# Patient Record
Sex: Female | Born: 1940 | Race: Black or African American | Hispanic: No | Marital: Married | Smoking: Never smoker
Health system: Southern US, Community
[De-identification: ages and names within clinical notes are randomized; demographics above are authoritative.]

## PROBLEM LIST (undated history)

## (undated) DIAGNOSIS — I10 Essential (primary) hypertension: Secondary | ICD-10-CM

## (undated) DIAGNOSIS — I639 Cerebral infarction, unspecified: Secondary | ICD-10-CM

## (undated) HISTORY — PX: TUBAL LIGATION: SHX77

---

## 2010-09-18 ENCOUNTER — Emergency Department (HOSPITAL_COMMUNITY): Payer: Medicare Other

## 2010-09-18 ENCOUNTER — Emergency Department (HOSPITAL_COMMUNITY)
Admission: EM | Admit: 2010-09-18 | Discharge: 2010-09-18 | Disposition: A | Discharge status: Home / Self Care | Payer: Medicare Other | Attending: Emergency Medicine | Admitting: Emergency Medicine

## 2010-09-18 DIAGNOSIS — IMO0002 Reserved for concepts with insufficient information to code with codable children: Secondary | ICD-10-CM | POA: Insufficient documentation

## 2010-09-18 DIAGNOSIS — M79609 Pain in unspecified limb: Secondary | ICD-10-CM | POA: Insufficient documentation

## 2010-09-18 DIAGNOSIS — M545 Low back pain, unspecified: Secondary | ICD-10-CM | POA: Insufficient documentation

## 2011-05-27 ENCOUNTER — Emergency Department (HOSPITAL_COMMUNITY)
Admission: EM | Admit: 2011-05-27 | Discharge: 2011-05-27 | Disposition: A | Discharge status: Home / Self Care | Payer: Medicare HMO | Attending: Emergency Medicine | Admitting: Emergency Medicine

## 2011-05-27 DIAGNOSIS — I10 Essential (primary) hypertension: Secondary | ICD-10-CM | POA: Insufficient documentation

## 2011-05-27 DIAGNOSIS — R55 Syncope and collapse: Secondary | ICD-10-CM

## 2011-05-27 DIAGNOSIS — R404 Transient alteration of awareness: Secondary | ICD-10-CM | POA: Insufficient documentation

## 2011-05-27 DIAGNOSIS — F4321 Adjustment disorder with depressed mood: Secondary | ICD-10-CM

## 2011-05-27 NOTE — ED Provider Notes (Signed)
History     CSN: [id number]  Arrival date & time 05/27/11  2240   First MD Initiated Contact with Patient 05/27/11 2327      Chief Complaint  Patient presents with  . Panic Attack  . Loss of Consciousness  . Hypertension    (Consider location/radiation/quality/duration/timing/severity/associated sxs/prior treatment) HPI Comments: Patient had just found her husband expired after a long battle with cancer.  She became emotional and passed out.  She feels fine now and wants to go home.    Patient is a 71 y.o. female presenting with syncope and hypertension. The history is provided by the patient.  Loss of Consciousness This is a new problem. The current episode started less than 1 hour ago. The problem has been resolved.  Hypertension    No past medical history on file.  No past surgical history on file.  No family history on file.  History  Substance Use Topics  . Smoking status: Not on file  . Smokeless tobacco: Not on file  . Alcohol Use: Not on file    OB History    No data available      Review of Systems  Cardiovascular: Positive for syncope.  All other systems reviewed and are negative.    Allergies  Review of patient's allergies indicates no known allergies.  Home Medications  No current outpatient prescriptions on file.  BP 156/90  Pulse 97  Temp(Src) 98.6 F (37 C) (Oral)  Resp 18  SpO2 97%  Physical Exam  Nursing note and vitals reviewed. Constitutional: She is oriented to person, place, and time. She appears well-developed and well-nourished. No distress.  HENT:  Head: Normocephalic and atraumatic.  Neck: Normal range of motion. Neck supple.  Cardiovascular: Normal rate and regular rhythm.  Exam reveals no gallop and no friction rub.   No murmur heard. Pulmonary/Chest: Effort normal and breath sounds normal. No respiratory distress. She has no wheezes.  Abdominal: Soft. Bowel sounds are normal. She exhibits no distension. There is no  tenderness.  Musculoskeletal: Normal range of motion.  Neurological: She is alert and oriented to person, place, and time.  Skin: Skin is warm and dry. She is not diaphoretic.    ED Course  Procedures (including critical care time)  Labs Reviewed - No data to display No results found.   1. Syncope   2. Grief reaction       MDM  The patient looks fine.  Her exam and vital signs are okay.  She wants to go home.  I am okay with this as this appears to be an acute grief reaction.  She is to return prn.          Geoffery Lyons, MD 05/27/11 [phone]

## 2011-05-27 NOTE — ED Notes (Signed)
WUJ:[id number]<JY> Expected date:05/27/11<BR> Expected time:<BR> Means of arrival:<BR> Comments:<BR> EMS 50 GC - htn

## 2011-05-27 NOTE — ED Notes (Signed)
Brought in by EMS from home with c/o anxiety attack with syncope. Pt c/o "a little headache, that's all". Denies dizziness, nausea. Per grandson, pt had "passed out two times, first time for about 1 minute but then she passed out again". Per EMS, pt found her husband "not breathing" around 2125 tonight--- pt "does not seem to accept that fact"--- "we took her out to the living room and she kept saying 'check him out'".

## 2011-05-27 NOTE — Discharge Instructions (Signed)
Grief Reaction  Grief is a normal response to the death of someone close to you. Feelings of fear, anger, and guilt can affect almost everyone who loses someone they love. Symptoms of depression are also common. These include problems with sleep, loss of appetite, and lack of energy. These grief reaction symptoms often last for weeks to months after a loss. They may also return during special times that remind you of the person you lost, such as an anniversary or birthday.  Anxiety, insomnia, irritability, and deep depression may last beyond the period of normal grief. If you experience these feelings for 6 months or longer, you may have clinical depression. Clinical depression requires further medical attention. If you think that you have clinical depression, you should contact your caregiver. If you have a history of depression and or a family history of depression, you are at greater risk of clinical depression. You are also at greater risk of developing clinical depression if the loss was traumatic or the loss was of someone with whom you had unresolved issues.    A grief reaction can become complicated by being blocked. This means being unable to cry or express extreme emotions. This may prolong the grieving period and worsen the emotional effects of the loss. Mourning is a natural event in human life. A healthy grief reaction is one that is not blocked . It requires a time of sadness and readjustment.It is very important to share your sorrow and fear with others, especially close friends and family. Professional counselors and clergy can also help you process your grief.  Document Released: 02/23/2005 Document Revised: 02/12/2011 Document Reviewed: 11/03/2005  [employer] Patient Information 2012 [employer].

## 2013-01-31 ENCOUNTER — Emergency Department (HOSPITAL_COMMUNITY)
Admission: EM | Admit: 2013-01-31 | Discharge: 2013-01-31 | Disposition: A | Discharge status: Home / Self Care | Payer: Medicare HMO | Attending: Emergency Medicine | Admitting: Emergency Medicine

## 2013-01-31 ENCOUNTER — Emergency Department (HOSPITAL_COMMUNITY): Payer: Medicare HMO

## 2013-01-31 ENCOUNTER — Encounter (HOSPITAL_COMMUNITY): Payer: Self-pay | Admitting: Emergency Medicine

## 2013-01-31 DIAGNOSIS — R0789 Other chest pain: Secondary | ICD-10-CM

## 2013-01-31 DIAGNOSIS — I10 Essential (primary) hypertension: Secondary | ICD-10-CM | POA: Insufficient documentation

## 2013-01-31 DIAGNOSIS — F411 Generalized anxiety disorder: Secondary | ICD-10-CM | POA: Insufficient documentation

## 2013-01-31 HISTORY — DX: Essential (primary) hypertension: I10

## 2013-01-31 LAB — CBC
Hemoglobin: 13.1 g/dL (ref 12.0–15.0)
MCH: 27.8 pg (ref 26.0–34.0)
Platelets: 264 10*3/uL (ref 150–400)
RBC: 4.72 MIL/uL (ref 3.87–5.11)
WBC: 9.2 10*3/uL (ref 4.0–10.5)

## 2013-01-31 LAB — POCT I-STAT TROPONIN I: Troponin i, poc: 0.01 ng/mL (ref 0.00–0.08)

## 2013-01-31 LAB — BASIC METABOLIC PANEL
CO2: 25 mEq/L (ref 19–32)
Calcium: 9.7 mg/dL (ref 8.4–10.5)
Potassium: 4.8 mEq/L (ref 3.5–5.1)
Sodium: 136 mEq/L (ref 135–145)

## 2013-01-31 MED ORDER — IBUPROFEN 200 MG PO TABS
600.0000 mg | ORAL_TABLET | Freq: Once | ORAL | Status: AC
  Administered 2013-01-31: 600 mg via ORAL
  Filled 2013-01-31: qty 3

## 2013-01-31 NOTE — ED Notes (Addendum)
Pt and daugther report pt has been under a lot of stress, husband died last year, pt cries often. Pt fairly active, pt went to gym last night. Reports left sided chest pain all day 8/10. Denies SOB. Pt very anxious at present  Reports hx of HTN, but does not take medications at this time. Reports she needs to have them changed because they make her nauseas.

## 2013-01-31 NOTE — Progress Notes (Signed)
   CARE MANAGEMENT ED NOTE 01/31/2013  Patient:  [facility]   Account Number:  [id number]  Date Initiated:  01/31/2013  Documentation initiated by:  Edd Arbour  Subjective/Objective Assessment:   72 yr old medicare pt without a pcp listed in EPIC pt confirms pcp is gerald     Subjective/Objective Assessment Detail:   pt reports anxious chest pain after going to gym last pm temp 99.1 oral bp 175/82 first troponin not showing abnormality     Action/Plan:   spoke with pt and daughter at bedside EPIC updated   Action/Plan Detail:   Anticipated DC Date:  01/31/2013     Status Recommendation to Physician:   Result of Recommendation:    Other ED Services  Consult Working Plan    DC Planning Services  Other  PCP issues  Outpatient Services - Pt will follow up    Choice offered to / List presented to:            Status of service:  Completed, signed off  ED Comments:   ED Comments Detail:

## 2013-01-31 NOTE — ED Notes (Signed)
md at bedside  Pt alert and oriented x4. Respirations even and unlabored, bilateral symmetrical rise and fall of chest. Skin warm and dry. In no acute distress. Denies needs.   

## 2013-01-31 NOTE — ED Notes (Signed)
Pt back from x-ray.

## 2013-01-31 NOTE — ED Notes (Signed)
Pt escorted to discharge window. Pt verbalized understanding discharge instructions. In no acute distress.  

## 2013-01-31 NOTE — ED Provider Notes (Signed)
CSN: [id number]     Arrival date & time 01/31/13  1631 History   First MD Initiated Contact with Patient 01/31/13 1642     Chief Complaint  Patient presents with  . Chest Pain   (Consider location/radiation/quality/duration/timing/severity/associated sxs/prior Treatment) HPI  72yF with CP. Woke up with it this morning. L anterior chest. Does not radiate. Constant.  Worse with movement of L arm. Pt anxious. Very quick to offer various possible explanations (things she ate, gas, etc). Goes to gym regularly including last night. Walked 1.5 miles on treadmill w/o symptoms. Additionally worked out "my arms" on machine. No change in symptoms with exertion. No SOB, cough, palpitations, diaphoresis, nausea. Past hx of HTN, otherwise healthy. Previous stress testing "years ago." Never had cath.    Past Medical History  Diagnosis Date  . Hypertension    History reviewed. No pertinent past surgical history. History reviewed. No pertinent family history. History  Substance Use Topics  . Smoking status: Never Smoker   . Smokeless tobacco: Not on file  . Alcohol Use: No   OB History   Grav Para Term Preterm Abortions TAB SAB Ect Mult Living                 Review of Systems  All systems reviewed and negative, other than as noted in HPI.   Allergies  Review of patient's allergies indicates no known allergies.  Home Medications  No current outpatient prescriptions on file. BP 175/82  Pulse 90  Temp(Src) 99.1 F (37.3 C) (Oral)  Resp 14  Ht 5\' 11"  (1.803 m)  Wt 223 lb (101.152 kg)  BMI 31.12 kg/m2  SpO2 97% Physical Exam  Nursing note and vitals reviewed. Constitutional: She appears well-developed and well-nourished. No distress.  HENT:  Head: Normocephalic and atraumatic.  Eyes: Conjunctivae are normal. Right eye exhibits no discharge. Left eye exhibits no discharge.  Neck: Neck supple.  Cardiovascular: Normal rate, regular rhythm and normal heart sounds.  Exam reveals no  gallop and no friction rub.   No murmur heard. Pulmonary/Chest: Effort normal and breath sounds normal. No respiratory distress.  Abdominal: Soft. She exhibits no distension. There is no tenderness.  Musculoskeletal: She exhibits no edema and no tenderness.  Lower extremities symmetric as compared to each other. No calf tenderness. Negative Homan's. No palpable cords. Tenderness L anterior chest near delto/pectoral junction. Pain worse with adduction of L shoulder. No concerning skin lesions noted.   Neurological: She is alert.  Skin: Skin is warm and dry.  Psychiatric: She has a normal mood and affect. Her behavior is normal. Thought content normal.    ED Course  Procedures (including critical care time) Labs Review Labs Reviewed  BASIC METABOLIC PANEL - Abnormal; Notable for the following:    GFR calc non Af Amer 58 (*)    GFR calc Af Amer 67 (*)    All other components within normal limits  CBC  POCT I-STAT TROPONIN I   Imaging Review No results found.  Dg Chest 2 View  01/31/2013   CLINICAL DATA:  Left-sided chest pain.  EXAM: CHEST  2 VIEW  COMPARISON:  None.  FINDINGS: The heart size and mediastinal contours are within normal limits. Both lungs are clear. The visualized skeletal structures are unremarkable.  IMPRESSION: No active cardiopulmonary disease.   Electronically Signed   By: Myles Rosenthal M.D.   On: 01/31/2013 17:57   EKG Interpretation    Date/Time:  Tuesday January 31 2013 16:40:01 EST Ventricular Rate:  78 PR Interval:  181 QRS Duration: 67 QT Interval:  363 QTC Calculation: 413 R Axis:   -10 Text Interpretation:  Sinus rhythm Probable left atrial enlargement Low voltage, precordial leads Non-specific ST-t changes No old tracing to compare Confirmed by Rainey Rodger  MD, Chia Mowers ([id number]) on 01/31/2013 4:43:57 PM            MDM   1. Musculoskeletal chest pain     72yF with CP. Atypical for ACS with constant duration through out today. No change with  exertion. Reproducible with palpation and with ROM of L shoulder. Most consistent with musculoskeletal etiology, specifically soreness of L pectoral. Doubt PE, Infectious, dissection or other potentially emergent etiology. EKG with no overt ischemic changes. No comparison. CXR and labs fairly unremarkable. Plan symptomatic tx at this time.     Raeford Razor, MD 02/03/13 [phone]

## 2013-02-22 ENCOUNTER — Other Ambulatory Visit: Payer: Self-pay

## 2013-02-22 DIAGNOSIS — Z1231 Encounter for screening mammogram for malignant neoplasm of breast: Secondary | ICD-10-CM

## 2013-03-06 ENCOUNTER — Ambulatory Visit
Admission: RE | Admit: 2013-03-06 | Discharge: 2013-03-06 | Disposition: A | Discharge status: Home / Self Care | Payer: Medicare HMO | Source: Ambulatory Visit

## 2013-03-06 DIAGNOSIS — Z1231 Encounter for screening mammogram for malignant neoplasm of breast: Secondary | ICD-10-CM

## 2014-10-26 ENCOUNTER — Emergency Department (HOSPITAL_COMMUNITY): Payer: Medicare HMO

## 2014-10-26 ENCOUNTER — Emergency Department (HOSPITAL_COMMUNITY)
Admission: EM | Admit: 2014-10-26 | Discharge: 2014-10-26 | Disposition: A | Discharge status: Home / Self Care | Payer: Medicare HMO | Attending: Emergency Medicine | Admitting: Emergency Medicine

## 2014-10-26 ENCOUNTER — Encounter (HOSPITAL_COMMUNITY): Payer: Self-pay | Admitting: *Deleted

## 2014-10-26 DIAGNOSIS — M19012 Primary osteoarthritis, left shoulder: Secondary | ICD-10-CM | POA: Diagnosis not present

## 2014-10-26 DIAGNOSIS — M25512 Pain in left shoulder: Secondary | ICD-10-CM

## 2014-10-26 DIAGNOSIS — Z79899 Other long term (current) drug therapy: Secondary | ICD-10-CM | POA: Insufficient documentation

## 2014-10-26 DIAGNOSIS — I10 Essential (primary) hypertension: Secondary | ICD-10-CM | POA: Diagnosis not present

## 2014-10-26 LAB — I-STAT CHEM 8, ED
BUN: 18 mg/dL (ref 6–20)
CREATININE: 1 mg/dL (ref 0.44–1.00)
Calcium, Ion: 1.18 mmol/L (ref 1.13–1.30)
Chloride: 103 mmol/L (ref 101–111)
Glucose, Bld: 107 mg/dL — ABNORMAL HIGH (ref 65–99)
HEMATOCRIT: 44 % (ref 36.0–46.0)
HEMOGLOBIN: 15 g/dL (ref 12.0–15.0)
POTASSIUM: 4 mmol/L (ref 3.5–5.1)
SODIUM: 140 mmol/L (ref 135–145)
TCO2: 24 mmol/L (ref 0–100)

## 2014-10-26 LAB — I-STAT TROPONIN, ED: TROPONIN I, POC: 0 ng/mL (ref 0.00–0.08)

## 2014-10-26 MED ORDER — NAPROXEN 500 MG PO TABS: 500.0000 mg | ORAL_TABLET | Freq: Two times a day (BID) | ORAL | Status: DC

## 2014-10-26 MED ORDER — METHOCARBAMOL 500 MG PO TABS: 500.0000 mg | ORAL_TABLET | Freq: Two times a day (BID) | ORAL | Status: DC

## 2014-10-26 NOTE — ED Provider Notes (Signed)
CSN: [id number]     Arrival date & time 10/26/14  1239 History   First MD Initiated Contact with Patient 10/26/14 1350     Chief Complaint  Patient presents with  . Left Shoulder Pain      (Consider location/radiation/quality/duration/timing/severity/associated sxs/prior Treatment) HPI Courtney Holmes is a 74 y.o. female with history of hypertension, presents to emergency partner complaining of left shoulder pain. Patient states the pain has been intermittent for the last year. She reports that she recently had a husband who passed away that she cared for as well as a mother also passed away that she cared for. Patient states she had to do a lot of lifting, pushing, pulling. She states she is left-handed and believes she might have injured her shoulder at some point. She states pain is worsened with any movement. Pain comes and goes, has been getting worse in the last several days. She states painful to sleep on that shoulder. She has painful to raise her arm up or reach for anything. She states she gets some tingling sensation in left hand once in a while. She denies any neck pain or neck injuries. She denies any decreased strength in the hand. She has not tried any medications for this in the past. She denies any chest pain or shortness of breath. She denies any other focal neurological symptoms.  Past Medical History  Diagnosis Date  . Hypertension    History reviewed. No pertinent past surgical history. History reviewed. No pertinent family history. Social History  Substance Use Topics  . Smoking status: Never Smoker   . Smokeless tobacco: None  . Alcohol Use: No   OB History    No data available     Review of Systems  Constitutional: Negative for fever and chills.  Respiratory: Negative for cough, chest tightness and shortness of breath.   Cardiovascular: Negative for chest pain, palpitations and leg swelling.  Genitourinary: Negative for dysuria, flank pain, vaginal discharge  and pelvic pain.  Musculoskeletal: Positive for arthralgias. Negative for joint swelling, neck pain and neck stiffness.  Skin: Negative for rash.  Neurological: Negative for dizziness, weakness and headaches.  All other systems reviewed and are negative.     Allergies  Review of patient's allergies indicates no known allergies.  Home Medications   Prior to Admission medications   Medication Sig Start Date End Date Taking? Authorizing Provider  calcium-vitamin D (OSCAL WITH D) 500-200 MG-UNIT per tablet Take 1 tablet by mouth daily with breakfast.   Yes Historical Provider, MD  Multiple Vitamin (MULTIVITAMIN WITH MINERALS) TABS tablet Take 1 tablet by mouth daily.   Yes Historical Provider, MD  triamterene-hydrochlorothiazide (MAXZIDE-25) 37.5-25 MG per tablet Take 1 tablet by mouth every other day.  08/01/14  Yes Historical Provider, MD   BP 128/75 mmHg  Pulse 80  Temp(Src) 98.1 F (36.7 C) (Oral)  Resp 16  SpO2 96% Physical Exam  Constitutional: She is oriented to person, place, and time. She appears well-developed and well-nourished. No distress.  HENT:  Head: Normocephalic.  Eyes: Conjunctivae are normal.  Neck: Neck supple.  No midline tenderness. Full range of motion  Cardiovascular: Normal rate, regular rhythm and normal heart sounds.   Pulmonary/Chest: Effort normal and breath sounds normal. No respiratory distress. She has no wheezes. She has no rales.  Musculoskeletal: She exhibits no edema.  Tenderness over left trapezius and left periscapular muscles. Tenderness over left lateral. Tenderness over left deltoid. Pain with range of motion of the shoulder in  all directions. Positive arm drop test. Bicep and tricep strength intact. Grip strength is 5 out of 5 and equal. Sensation is intact in the left hand. No obvious swelling or joint effusion noted.  Neurological: She is alert and oriented to person, place, and time.  Skin: Skin is warm and dry.  Psychiatric: She has a  normal mood and affect. Her behavior is normal.  Nursing note and vitals reviewed.   ED Course  Procedures (including critical care time) Labs Review Labs Reviewed  I-STAT CHEM 8, ED - Abnormal; Notable for the following:    Glucose, Bld 107 (*)    All other components within normal limits  I-STAT TROPOININ, ED    Imaging Review Dg Chest 2 View  10/26/2014   CLINICAL DATA:  LEFT shoulder pains diverting 6 months ago. Initial encounter.  EXAM: CHEST  2 VIEW  COMPARISON:  01/31/2013.  FINDINGS: Cardiopericardial silhouette within normal limits. Mediastinal contours normal. Trachea midline. No airspace disease or effusion. Partially translucent monitoring leads project over the chest.  IMPRESSION: No active cardiopulmonary disease.   Electronically Signed   By: Andreas Newport M.D.   On: 10/26/2014 15:00   Dg Shoulder Left  10/26/2014   CLINICAL DATA:  Shoulder pain. Shoulder pain for 6 months. Initial encounter. Increasing pain over time.  EXAM: LEFT SHOULDER - 2+ VIEW  COMPARISON:  None.  FINDINGS: Mild glenohumeral osteoarthritis is present. Mild to moderate AC joint osteoarthritis. Glenohumeral joint is located. Negative for fracture. Visible RIGHT chest appears normal.  IMPRESSION: Mild osteoarthritis of the LEFT shoulder. No acute osseous abnormality.   Electronically Signed   By: Andreas Newport M.D.   On: 10/26/2014 14:59   I have personally reviewed and evaluated these images and lab results as part of my medical decision-making.   EKG Interpretation None      MDM   Final diagnoses:  Left shoulder pain  Primary osteoarthritis of left shoulder     patient with nontraumatic left shoulder pain for a year. Complains of some tingling in left hand. Discussed possibility of this being radicular pain versus shoulder pain. Triage nurses ordered blood work to rule out cardiac source. EKG unchanged with some artifact, labs including troponin and Chem-8 unremarkable. Patient's pain is  clearly a producible with left shoulder movement and palpation of the surrounding muscles. Her strength and sensation in the hand intact. I do not think this is due to a cardiac problem. X-ray showed osteo- arthritis. Plan to discharge home with NSAIDs, Robaxin for possible spasms. Follow-up with orthopedic specialist.  Pt neurovascularly intact. VS normal. Stable for dc/   Filed Vitals:   10/26/14 1248  BP: 128/75  Pulse: 80  Temp: 98.1 F (36.7 C)  TempSrc: Oral  Resp: 16  SpO2: 96%      Jaynie Crumble, PA-C 10/26/14 1525  Samuel Jester, DO 10/28/14 1428

## 2014-10-26 NOTE — Discharge Instructions (Signed)
Naprosyn for pain and inflammation. Robaxin for spasms. Try ice/heat. Follow up with primary care doctor or orthopedics specialist for further evaluation.   Shoulder Pain The shoulder is the joint that connects your arms to your body. The bones that form the shoulder joint include the upper arm bone (humerus), the shoulder blade (scapula), and the collarbone (clavicle). The top of the humerus is shaped like a ball and fits into a rather flat socket on the scapula (glenoid cavity). A combination of muscles and strong, fibrous tissues that connect muscles to bones (tendons) support your shoulder joint and hold the ball in the socket. Small, fluid-filled sacs (bursae) are located in different areas of the joint. They act as cushions between the bones and the overlying soft tissues and help reduce friction between the gliding tendons and the bone as you move your arm. Your shoulder joint allows a wide range of motion in your arm. This range of motion allows you to do things like scratch your back or throw a ball. However, this range of motion also makes your shoulder more prone to pain from overuse and injury. Causes of shoulder pain can originate from both injury and overuse and usually can be grouped in the following four categories:  Redness, swelling, and pain (inflammation) of the tendon (tendinitis) or the bursae (bursitis).  Instability, such as a dislocation of the joint.  Inflammation of the joint (arthritis).  Broken bone (fracture). HOME CARE INSTRUCTIONS   Apply ice to the sore area.  Put ice in a plastic bag.  Place a towel between your skin and the bag.  Leave the ice on for 15-20 minutes, 3-4 times per day for the first 2 days, or as directed by your health care provider.  Stop using cold packs if they do not help with the pain.  If you have a shoulder sling or immobilizer, wear it as long as your caregiver instructs. Only remove it to shower or bathe. Move your arm as little as  possible, but keep your hand moving to prevent swelling.  Squeeze a soft ball or foam pad as much as possible to help prevent swelling.  Only take over-the-counter or prescription medicines for pain, discomfort, or fever as directed by your caregiver. SEEK MEDICAL CARE IF:   Your shoulder pain increases, or new pain develops in your arm, hand, or fingers.  Your hand or fingers become cold and numb.  Your pain is not relieved with medicines. SEEK IMMEDIATE MEDICAL CARE IF:   Your arm, hand, or fingers are numb or tingling.  Your arm, hand, or fingers are significantly swollen or turn white or blue. MAKE SURE YOU:   Understand these instructions.  Will watch your condition.  Will get help right away if you are not doing well or get worse. Document Released: 12/03/2004 Document Revised: 07/10/2013 Document Reviewed: 02/07/2011 [facility] Patient Information 2015 [place], [place]. This information is not intended to replace advice given to you by your health care provider. Make sure you discuss any questions you have with your health care provider.

## 2014-10-26 NOTE — ED Notes (Addendum)
Pt reports she is left handed, was taking care of her disabled husband, started having left shoulder pain x6 months, pain increased over time. Pt reports started having left axilla pain x3 days. Pain increases with movement.  Denies SOB. Denies n/v. Denies cardiac hx.

## 2015-03-05 ENCOUNTER — Other Ambulatory Visit: Payer: Self-pay

## 2015-03-05 DIAGNOSIS — Z1231 Encounter for screening mammogram for malignant neoplasm of breast: Secondary | ICD-10-CM

## 2015-03-29 ENCOUNTER — Ambulatory Visit
Admission: RE | Admit: 2015-03-29 | Discharge: 2015-03-29 | Disposition: A | Discharge status: Home / Self Care | Payer: Medicare HMO | Source: Ambulatory Visit

## 2015-03-29 DIAGNOSIS — Z1231 Encounter for screening mammogram for malignant neoplasm of breast: Secondary | ICD-10-CM

## 2015-09-14 ENCOUNTER — Emergency Department (HOSPITAL_COMMUNITY)
Admission: EM | Admit: 2015-09-14 | Discharge: 2015-09-14 | Disposition: A | Discharge status: Home / Self Care | Payer: Medicare HMO | Attending: Emergency Medicine | Admitting: Emergency Medicine

## 2015-09-14 ENCOUNTER — Emergency Department (HOSPITAL_COMMUNITY): Payer: Medicare HMO

## 2015-09-14 ENCOUNTER — Encounter (HOSPITAL_COMMUNITY): Payer: Self-pay

## 2015-09-14 DIAGNOSIS — I10 Essential (primary) hypertension: Secondary | ICD-10-CM | POA: Diagnosis not present

## 2015-09-14 DIAGNOSIS — K5732 Diverticulitis of large intestine without perforation or abscess without bleeding: Secondary | ICD-10-CM | POA: Insufficient documentation

## 2015-09-14 DIAGNOSIS — Z79899 Other long term (current) drug therapy: Secondary | ICD-10-CM | POA: Insufficient documentation

## 2015-09-14 DIAGNOSIS — R1084 Generalized abdominal pain: Secondary | ICD-10-CM | POA: Diagnosis present

## 2015-09-14 LAB — URINALYSIS, ROUTINE W REFLEX MICROSCOPIC
BILIRUBIN URINE: NEGATIVE
GLUCOSE, UA: NEGATIVE mg/dL
KETONES UR: NEGATIVE mg/dL
LEUKOCYTES UA: NEGATIVE
Nitrite: NEGATIVE
PROTEIN: NEGATIVE mg/dL
Specific Gravity, Urine: 1.019 (ref 1.005–1.030)
pH: 5.5 (ref 5.0–8.0)

## 2015-09-14 LAB — COMPREHENSIVE METABOLIC PANEL
ALBUMIN: 4 g/dL (ref 3.5–5.0)
ALT: 14 U/L (ref 14–54)
AST: 15 U/L (ref 15–41)
Alkaline Phosphatase: 88 U/L (ref 38–126)
Anion gap: 8 (ref 5–15)
BUN: 10 mg/dL (ref 6–20)
CO2: 25 mmol/L (ref 22–32)
Calcium: 8.7 mg/dL — ABNORMAL LOW (ref 8.9–10.3)
Chloride: 102 mmol/L (ref 101–111)
Creatinine, Ser: 0.87 mg/dL (ref 0.44–1.00)
GFR calc Af Amer: >60 mL/min (ref >60)
GFR calc non Af Amer: >60 mL/min (ref >60)
Glucose, Bld: 110 mg/dL — ABNORMAL HIGH (ref 65–99)
POTASSIUM: 3.6 mmol/L (ref 3.5–5.1)
SODIUM: 135 mmol/L (ref 135–145)
TOTAL PROTEIN: 6.9 g/dL (ref 6.5–8.1)
Total Bilirubin: 2.1 mg/dL — ABNORMAL HIGH (ref 0.3–1.2)

## 2015-09-14 LAB — URINE MICROSCOPIC-ADD ON

## 2015-09-14 LAB — CBC
HEMATOCRIT: 34.9 % — AB (ref 36.0–46.0)
HEMOGLOBIN: 11.7 g/dL — AB (ref 12.0–15.0)
MCH: 27.5 pg (ref 26.0–34.0)
MCHC: 33.5 g/dL (ref 30.0–36.0)
MCV: 81.9 fL (ref 78.0–100.0)
Platelets: 217 10*3/uL (ref 150–400)
RBC: 4.26 MIL/uL (ref 3.87–5.11)
RDW: 13.6 % (ref 11.5–15.5)
WBC: 12.2 10*3/uL — AB (ref 4.0–10.5)

## 2015-09-14 MED ORDER — FENTANYL CITRATE (PF) 100 MCG/2ML IJ SOLN: 25.0000 ug | Freq: Once | INTRAMUSCULAR | Status: DC

## 2015-09-14 MED ORDER — SODIUM CHLORIDE 0.9 % IV BOLUS (SEPSIS)
500.0000 mL | Freq: Once | INTRAVENOUS | Status: AC
  Administered 2015-09-14: 500 mL via INTRAVENOUS

## 2015-09-14 MED ORDER — CIPROFLOXACIN HCL 500 MG PO TABS
500.0000 mg | ORAL_TABLET | Freq: Once | ORAL | Status: AC
  Administered 2015-09-14: 500 mg via ORAL
  Filled 2015-09-14: qty 1

## 2015-09-14 MED ORDER — CIPROFLOXACIN HCL 500 MG PO TABS: 500.0000 mg | ORAL_TABLET | Freq: Two times a day (BID) | ORAL | Status: DC

## 2015-09-14 MED ORDER — FENTANYL CITRATE (PF) 100 MCG/2ML IJ SOLN
12.5000 ug | Freq: Once | INTRAMUSCULAR | Status: AC
  Administered 2015-09-14: 12.5 ug via INTRAVENOUS
  Filled 2015-09-14: qty 2

## 2015-09-14 MED ORDER — IOPAMIDOL (ISOVUE-300) INJECTION 61%
100.0000 mL | Freq: Once | INTRAVENOUS | Status: AC | PRN
  Administered 2015-09-14: 100 mL via INTRAVENOUS

## 2015-09-14 MED ORDER — METRONIDAZOLE 500 MG PO TABS: 500.0000 mg | ORAL_TABLET | Freq: Two times a day (BID) | ORAL | Status: DC

## 2015-09-14 MED ORDER — HYDROCODONE-ACETAMINOPHEN 5-325 MG PO TABS
2.0000 | ORAL_TABLET | Freq: Once | ORAL | Status: AC
  Administered 2015-09-14: 2 via ORAL
  Filled 2015-09-14: qty 2

## 2015-09-14 MED ORDER — HYDROCODONE-ACETAMINOPHEN 5-325 MG PO TABS: 1.0000 | ORAL_TABLET | Freq: Four times a day (QID) | ORAL | Status: DC | PRN

## 2015-09-14 MED ORDER — METRONIDAZOLE 500 MG PO TABS
500.0000 mg | ORAL_TABLET | Freq: Once | ORAL | Status: AC
  Administered 2015-09-14: 500 mg via ORAL
  Filled 2015-09-14: qty 1

## 2015-09-14 MED ORDER — FAMOTIDINE IN NACL 20-0.9 MG/50ML-% IV SOLN
20.0000 mg | Freq: Once | INTRAVENOUS | Status: AC
  Administered 2015-09-14: 20 mg via INTRAVENOUS
  Filled 2015-09-14: qty 50

## 2015-09-14 NOTE — ED Notes (Signed)
Pt states she's had generalized low abdominal pain x few days.  C/O urinary frequency but not sure if she's emptying all the way.  LBM today and states they have been normal.  States she tripped over a kid on July 4th at a party hitting her LT arm & head.

## 2015-09-14 NOTE — Discharge Instructions (Signed)
Ms. Courtney GarnetBarbara Holmes,  Nice meeting you! Please follow-up with your primary care provider regarding the blood in your urine. You will need this rechecked in two weeks. Please follow-up with gastroenterology regarding your diverticulitis. Return to the emergency department if you develop fevers, chills, increased abdominal pain, inability to keep foods down, new/worsening symptoms. Feel better soon!  S. Lane HackerNicole Aubryana Vittorio, PA-C  Diverticulitis Diverticulitis is when small pockets that have formed in your colon (large intestine) become infected or swollen. HOME CARE  Follow your doctor's instructions.  Follow a special diet if told by your doctor.  When you feel better, your doctor may tell you to change your diet. You may be told to eat a lot of fiber. Fruits and vegetables are good sources of fiber. Fiber makes it easier to poop (have bowel movements).  Take supplements or probiotics as told by your doctor.  Only take medicines as told by your doctor.  Keep all follow-up visits with your doctor. GET HELP IF:  Your pain does not get better.  You have a hard time eating food.  You are not pooping like normal. GET HELP RIGHT AWAY IF:  Your pain gets worse.  Your problems do not get better.  Your problems suddenly get worse.  You have a fever.  You keep throwing up (vomiting).  You have bloody or black, tarry poop (stool). MAKE SURE YOU:   Understand these instructions.  Will watch your condition.  Will get help right away if you are not doing well or get worse.   This information is not intended to replace advice given to you by your health care provider. Make sure you discuss any questions you have with your health care provider.   Document Released: 08/12/2007 Document Revised: 02/28/2013 Document Reviewed: 01/18/2013 Elsevier Interactive Patient Education [employer]

## 2015-09-14 NOTE — ED Notes (Signed)
Bed: WTR7 Expected date:  Expected time:  Means of arrival:  Comments: Phlebotomy 

## 2015-09-14 NOTE — ED Provider Notes (Signed)
Medical screening examination/treatment/procedure(s) were conducted as a shared visit with non-physician practitioner(s) and myself.  I personally evaluated the patient during the encounter.   EKG Interpretation None     Patient here after having generalized lower abdominal pain times a few days. Abdominal CT positive for diverticulitis. She has no signs of acute abdomen. Patient be treated here and given GI referral  Lorre NickAnthony Berlyn Saylor, MD 09/14/15 [phone]

## 2015-09-14 NOTE — ED Provider Notes (Signed)
CSN: [id number]     Arrival date & time 09/14/15  1404 History   First MD Initiated Contact with Patient 09/14/15 1611     Chief Complaint  Patient presents with  . Abdominal Pain   HPI  Courtney Holmes is a 75 y.o. female PMH significant for HTN presenting with a 3 day history of lower abdominal pain and urinary frequency. She describes her pain as 10/10 pain scale, constant, lower in location, nonradiating, not related to BM/eating. She denies fevers, chills, SOB, CP, N/V/D, dysuria.    Past Medical History  Diagnosis Date  . Hypertension    Past Surgical History  Procedure Laterality Date  . Tubal ligation     No family history on file. Social History  Substance Use Topics  . Smoking status: Never Smoker   . Smokeless tobacco: Not on file  . Alcohol Use: No   OB History    No data available     Review of Systems  Ten systems are reviewed and are negative for acute change except as noted in the HPI  Allergies  Review of patient's allergies indicates no known allergies.  Home Medications   Prior to Admission medications   Medication Sig Start Date End Date Taking? Authorizing Provider  calcium-vitamin D (OSCAL WITH D) 500-200 MG-UNIT per tablet Take 1 tablet by mouth daily with breakfast.    Historical Provider, MD  methocarbamol (ROBAXIN) 500 MG tablet Take 1 tablet (500 mg total) by mouth 2 (two) times daily. 10/26/14   Tatyana Kirichenko, PA-C  Multiple Vitamin (MULTIVITAMIN WITH MINERALS) TABS tablet Take 1 tablet by mouth daily.    Historical Provider, MD  naproxen (NAPROSYN) 500 MG tablet Take 1 tablet (500 mg total) by mouth 2 (two) times daily. 10/26/14   Tatyana Kirichenko, PA-C  triamterene-hydrochlorothiazide (MAXZIDE-25) 37.5-25 MG per tablet Take 1 tablet by mouth every other day.  08/01/14   Historical Provider, MD   BP 144/70 mmHg  Pulse 82  Temp(Src) 99.2 F (37.3 C) (Oral)  Resp 18  Ht  (1.753 m)  Wt 102.059 kg  BMI 33.21 kg/m2  SpO2  96% Physical Exam  Constitutional: She appears well-developed and well-nourished. No distress.  HENT:  Head: Normocephalic and atraumatic.  Mouth/Throat: Oropharynx is clear and moist. No oropharyngeal exudate.  Eyes: Conjunctivae are normal. Pupils are equal, round, and reactive to light. Right eye exhibits no discharge. Left eye exhibits no discharge. No scleral icterus.  Neck: No tracheal deviation present.  Cardiovascular: Normal rate, regular rhythm, normal heart sounds and intact distal pulses.  Exam reveals no gallop and no friction rub.   No murmur heard. Pulmonary/Chest: Effort normal and breath sounds normal. No respiratory distress. She has no wheezes. She has no rales. She exhibits no tenderness.  Abdominal: Soft. Bowel sounds are normal. She exhibits no distension and no mass. There is tenderness. There is no rebound and no guarding.  BL lower abdominal tenderness.  Musculoskeletal: She exhibits no edema or tenderness.  No midline cervical, thoracic, lumbar tenderness.   Lymphadenopathy:    She has no cervical adenopathy.  Neurological: She is alert. Coordination normal.  Skin: Skin is warm and dry. No rash noted. She is not diaphoretic. No erythema.  Psychiatric: She has a normal mood and affect. Her behavior is normal.  Nursing note and vitals reviewed.   ED Course  Procedures  Labs Review Labs Reviewed  URINALYSIS, ROUTINE W REFLEX MICROSCOPIC (NOT AT [facility]) - Abnormal; Notable for the following:  Color, Urine AMBER (*)    APPearance CLOUDY (*)    Hgb urine dipstick TRACE (*)    All other components within normal limits  COMPREHENSIVE METABOLIC PANEL - Abnormal; Notable for the following:    Glucose, Bld 110 (*)    Calcium 8.7 (*)    Total Bilirubin 2.1 (*)    All other components within normal limits  CBC - Abnormal; Notable for the following:    WBC 12.2 (*)    Hemoglobin 11.7 (*)    HCT 34.9 (*)    All other components within normal limits  URINE  MICROSCOPIC-ADD ON - Abnormal; Notable for the following:    Squamous Epithelial / LPF 0-5 (*)    Bacteria, UA RARE (*)    All other components within normal limits    Imaging Review Ct Abdomen Pelvis W Contrast  09/14/2015  CLINICAL DATA:  Generalized low abdominal pain several days. EXAM: CT ABDOMEN AND PELVIS WITH CONTRAST TECHNIQUE: Multidetector CT imaging of the abdomen and pelvis was performed using the standard protocol following bolus administration of intravenous contrast. CONTRAST:  100mL ISOVUE-300 IOPAMIDOL (ISOVUE-300) INJECTION 61% COMPARISON:  None. FINDINGS: Lower chest: Lung bases are clear. Hepatobiliary: No focal hepatic lesion. Multiple gallstones the lumen gallbladder. No evidence acute inflammation. Common bile duct normal caliber. Pancreas: Pancreas is normal. No ductal dilatation. No pancreatic inflammation. Spleen: Normal spleen Adrenals/urinary tract: Adrenal glands and kidneys are normal. The ureters and bladder normal. Stomach/Bowel: Stomach, small bowel, appendix and cecum normal. Multiple diverticula scattered throughout the colon. 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 In the distal sigmoid colon, 8 cm segment of pericolonic inflammationand bowel wall thickening. Findings consistent with acute diverticulitis. No evidence of macro perforation or abscess. Rectum is normal. Vascular/Lymphatic: Abdominal aorta is normal caliber. There is no retroperitoneal or periportal lymphadenopathy. No pelvic lymphadenopathy. Reproductive: Uterus and ovaries normal. Other: No free fluid. Musculoskeletal: No aggressive osseous lesion. IMPRESSION: 1. Acute sigmoid diverticulitis. No evidence of macro perforation or abscess. 2. Cholelithiasis. Electronically Signed   By: Genevive BiStewart  Edmunds M.D.   On: 09/14/2015 18:08   I have personally reviewed and evaluated these images and lab results as part of my medical decision-making.  MDM   Final diagnoses:  Diverticulitis of large  intestine without perforation or abscess without bleeding   Patient nontoxic appearing, VSS. CMP with bili of 2.1, calcium of 8.7. CBC with leukocytosis of 12.2. UA with trace hgb and 6-30 RBC, negative for infection.  CT abdomen/pelvis demonstrates acute sigmoid diverticulitis without abscess or perforation. Patient given abx and GI referral. Patient may be safely discharged home with cipro, flagyl, norco. Discussed reasons for return. Patient to follow-up with GI. Patient in understanding and agreement with the plan.  Melton KrebsSamantha Nicole Careem Yasui, PA-C 09/27/15 1438

## 2017-10-30 IMAGING — CT CT ABD-PELV W/ CM
2 of 5 series · 17 of 46 positions shown, 19 images · IV contrast (iopamidol)
Comparison: None.

CLINICAL DATA: Generalized low abdominal pain several days.

EXAM:
CT ABDOMEN AND PELVIS WITH CONTRAST
TECHNIQUE: Multidetector CT imaging of the abdomen and pelvis was performed
using the standard protocol following bolus administration of
intravenous contrast.
CONTRAST:  100mL J4MEEP-N99 IOPAMIDOL (J4MEEP-N99) INJECTION 61%

[Series 2: abd/pel with · axial · 0.83mm/px · z∈[-393,-3]mm · 14 of 92 slices shown, 16 images]
[im 7/92  soft-tissue]
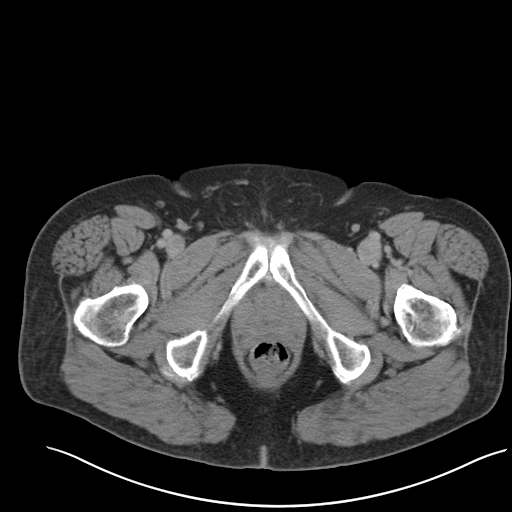
[im 7/92  bone]
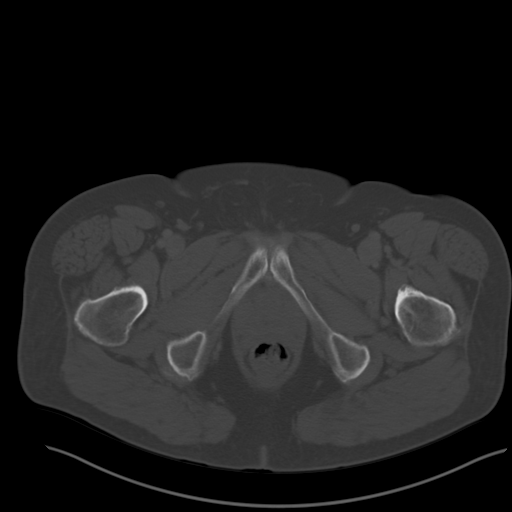
[im 13/92  soft-tissue]
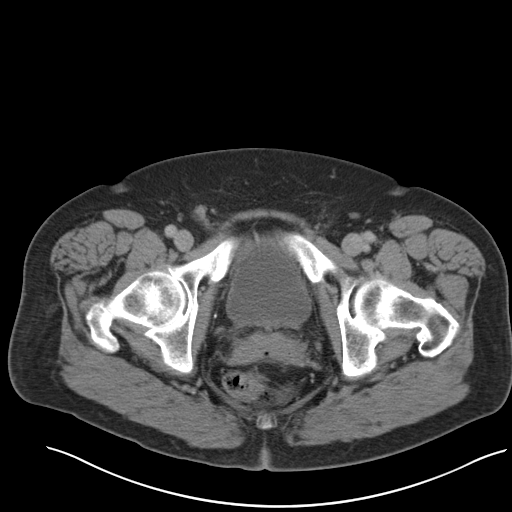
[im 19/92  soft-tissue]
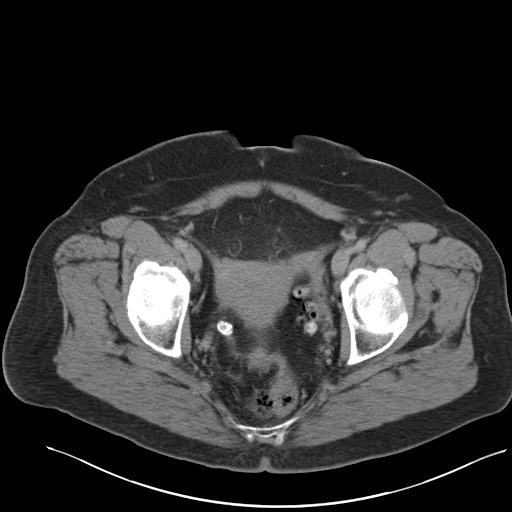
[im 25/92  soft-tissue]
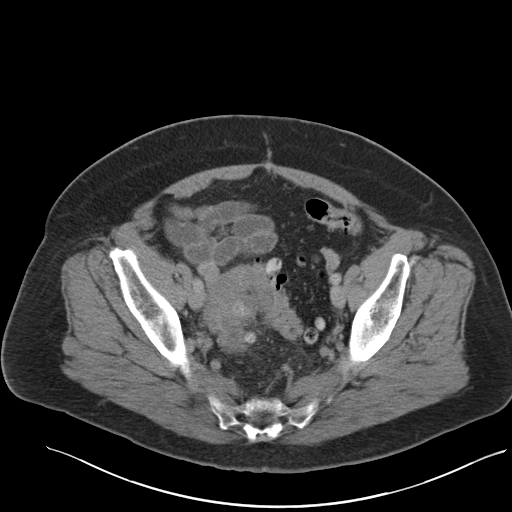
[im 31/92  soft-tissue]
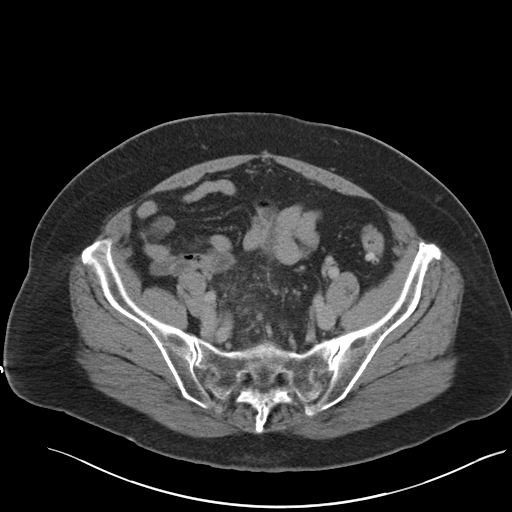
[im 37/92  soft-tissue]
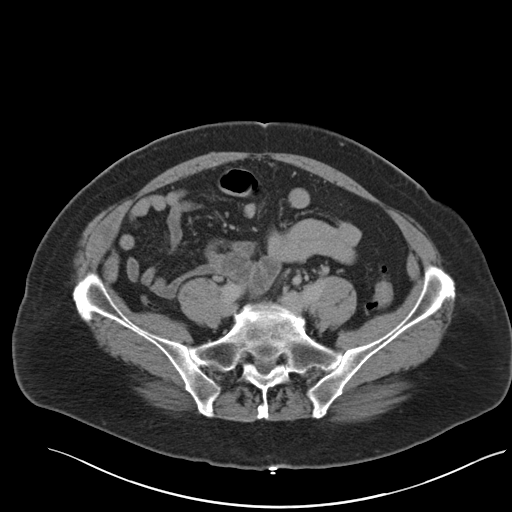
[im 43/92  soft-tissue]
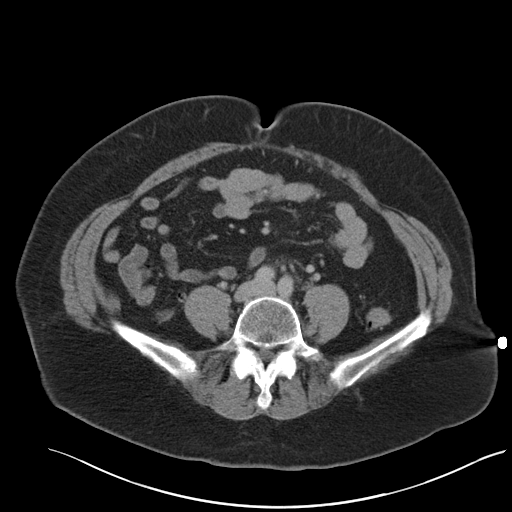
[im 49/92  soft-tissue]
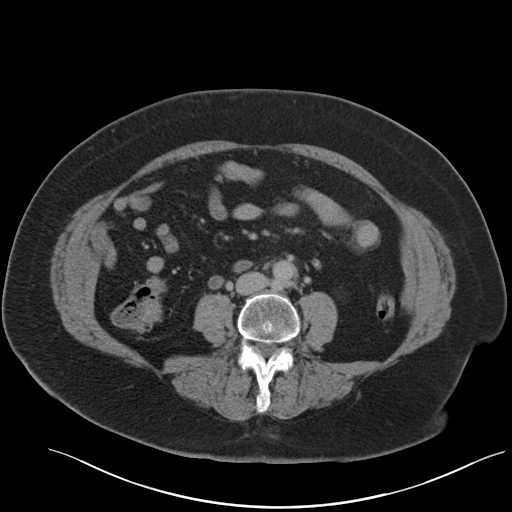
[im 55/92  soft-tissue]
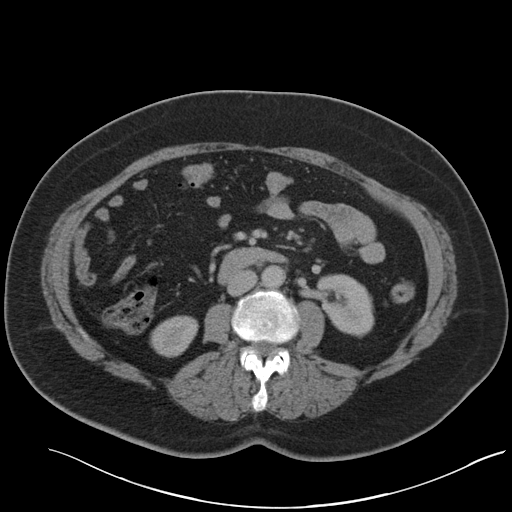
[im 55/92  bone]
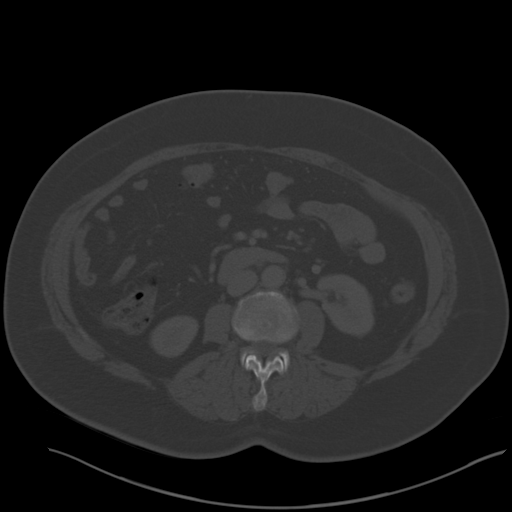
[im 61/92  soft-tissue]
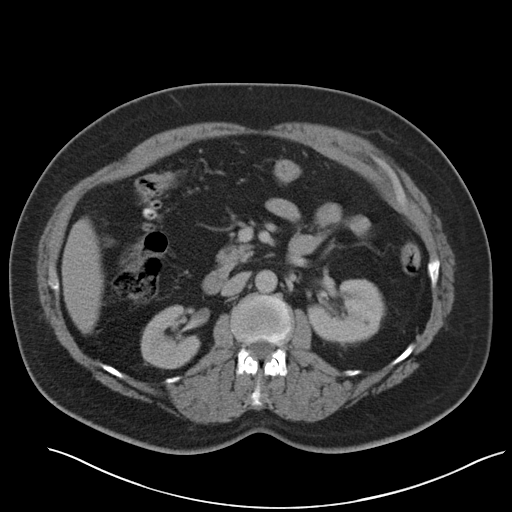
[im 67/92  soft-tissue]
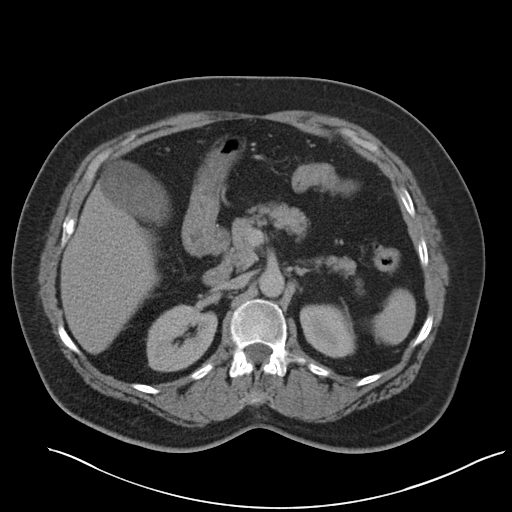
[im 73/92  soft-tissue]
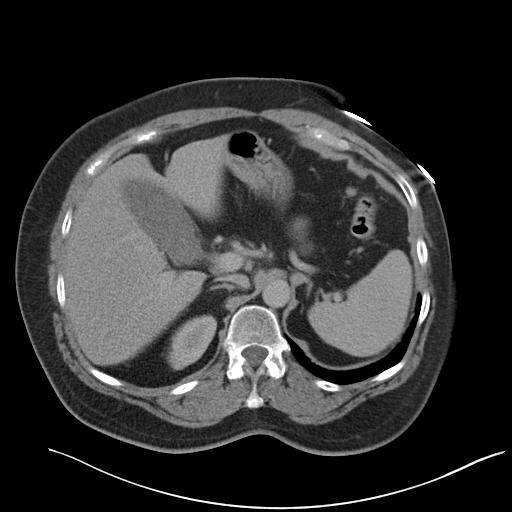
[im 79/92  soft-tissue]
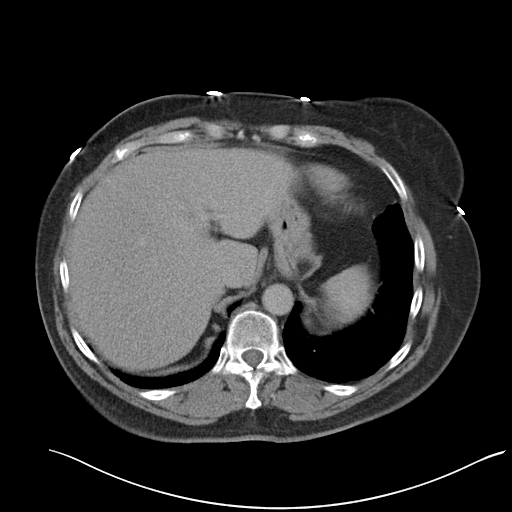
[im 85/92  soft-tissue]
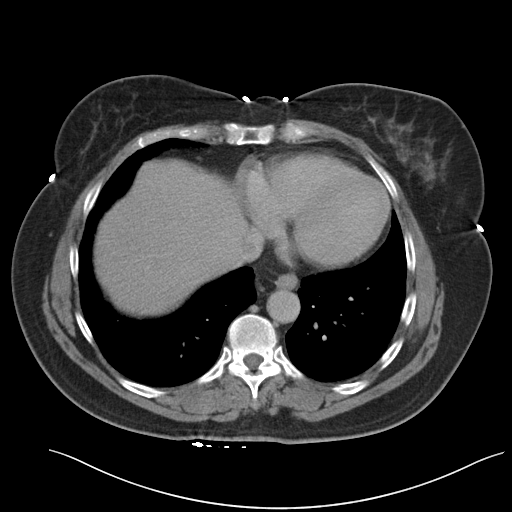

[Series 5: coronal a/|p · coronal · 0.85mm/px · 3 of 189 slices shown]
[im 63/189  soft-tissue]
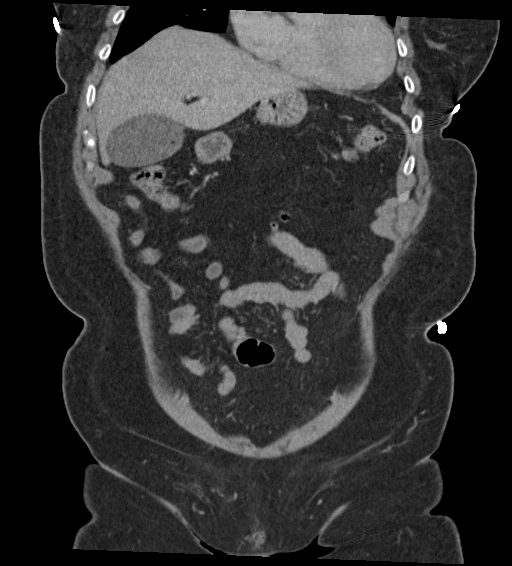
[im 84/189  soft-tissue]
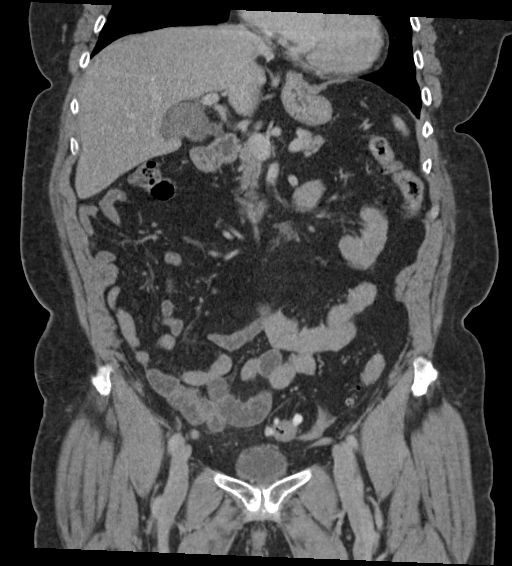
[im 105/189  soft-tissue]
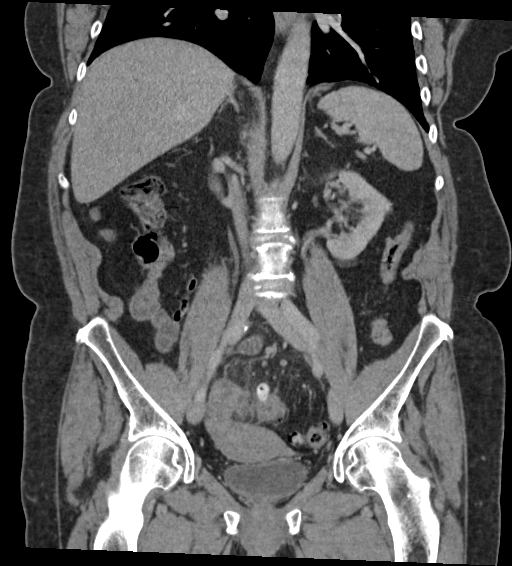

[17 of 46 positions shown; findings below may reference images not displayed]

FINDINGS: Lower chest: Lung bases are clear.

Hepatobiliary: No focal hepatic lesion. Multiple gallstones the
lumen gallbladder. No evidence acute inflammation. Common bile duct
normal caliber.

Pancreas: Pancreas is normal. No ductal dilatation. No pancreatic
inflammation.

Spleen: Normal spleen

Adrenals/urinary tract: Adrenal glands and kidneys are normal. The
ureters and bladder normal.

Stomach/Bowel: Stomach, small bowel, appendix and cecum normal.
Multiple diverticula scattered throughout the colon. Heavy
diverticular disease of the sigmoid colon. In the distal sigmoid
colon, 8 cm segment of pericolonic inflammationand bowel wall
thickening. Findings consistent with acute diverticulitis. No
evidence of macro perforation or abscess. Rectum is normal.

Vascular/Lymphatic: Abdominal aorta is normal caliber. There is no
retroperitoneal or periportal lymphadenopathy. No pelvic
lymphadenopathy.

Reproductive: Uterus and ovaries normal.

Other: No free fluid.

Musculoskeletal: No aggressive osseous lesion.
IMPRESSION: 1. Acute sigmoid diverticulitis. No evidence of macro perforation or
abscess.
2. Cholelithiasis.

## 2019-05-08 ENCOUNTER — Ambulatory Visit: Payer: Medicare HMO | Attending: Internal Medicine

## 2019-05-08 ENCOUNTER — Ambulatory Visit: Payer: Self-pay

## 2019-05-08 DIAGNOSIS — Z23 Encounter for immunization: Secondary | ICD-10-CM

## 2019-05-08 NOTE — Progress Notes (Signed)
   Covid-19 Vaccination Clinic  Name:  Courtney Holmes    MRN: [id number] DOB: 14-May-1940  05/08/2019  Courtney Holmes was observed post Covid-19 immunization for 15 minutes without incidence. She was provided with Vaccine Information Sheet and instruction to access the V-Safe system.   Courtney Holmes was instructed to call 911 with any severe reactions post vaccine: Marland Kitchen Difficulty breathing  . Swelling of your face and throat  . A fast heartbeat  . A bad rash all over your body  . Dizziness and weakness    Immunizations Administered    Name Date Dose VIS Date Route   Pfizer COVID-19 Vaccine 05/08/2019  9:38 AM 0.3 mL 02/17/2019 Intramuscular   Manufacturer: [employer], [employer]   Lot: [id number]   NDC: [address]-7

## 2019-06-06 ENCOUNTER — Ambulatory Visit: Payer: Medicare HMO | Attending: Internal Medicine

## 2019-06-06 DIAGNOSIS — Z23 Encounter for immunization: Secondary | ICD-10-CM

## 2019-06-06 NOTE — Progress Notes (Signed)
   Covid-19 Vaccination Clinic  Name:  Courtney Holmes    MRN: [id number] DOB: 1940/09/21  06/06/2019  [name] was observed post Covid-19 immunization for 15 minutes without incident. She was provided with Vaccine Information Sheet and instruction to access the V-Safe system.   [name] was instructed to call 911 with any severe reactions post vaccine: Marland Kitchen Difficulty breathing  . Swelling of face and throat  . A fast heartbeat  . A bad rash all over body  . Dizziness and weakness   Immunizations Administered    Name Date Dose VIS Date Route   Pfizer COVID-19 Vaccine 06/06/2019  9:05 AM 0.3 mL 02/17/2019 Intramuscular   Manufacturer: [employer], [employer]   Lot: [id number]   NDC: [address]-7

## 2023-02-27 ENCOUNTER — Encounter (HOSPITAL_COMMUNITY): Payer: Self-pay | Admitting: *Deleted

## 2023-02-27 ENCOUNTER — Other Ambulatory Visit: Payer: Self-pay

## 2023-02-27 ENCOUNTER — Emergency Department (HOSPITAL_COMMUNITY)
Admission: EM | Admit: 2023-02-27 | Discharge: 2023-02-28 | Disposition: A | Discharge status: Home / Self Care | Payer: Self-pay | Attending: Emergency Medicine | Admitting: Emergency Medicine

## 2023-02-27 DIAGNOSIS — R55 Syncope and collapse: Secondary | ICD-10-CM | POA: Insufficient documentation

## 2023-02-27 NOTE — ED Triage Notes (Signed)
The pt arrived by gems from home where there was a christmas party she stood up and she says everything went dark and she kind of fell over  no pain except around both her eyes and her nose

## 2023-02-27 NOTE — ED Provider Notes (Signed)
[name] EMERGENCY DEPARTMENT AT [facility] Provider Note   CSN: [id number] Arrival date & time: 02/27/23  2333     History {Add pertinent medical, surgical, social history, OB history to HPI:1} Chief Complaint  Patient presents with   Near Syncope    Courtney Holmes is a 82 y.o. female.  Patient presents to the emergency department after near syncopal episode.  Patient was at a holiday party tonight with family.  She had been sitting for a while, attempted to stand up and then things went black.  She reports that she fell back onto the couch, no injury.  She felt very weak and dazed for a while.  She has slowly come back to her normal baseline.  No palpitations, chest pain, shortness of breath.  Had a slight headache earlier, this has resolved.       Home Medications Prior to Admission medications   Medication Sig Start Date End Date Taking? Authorizing Provider  acetaminophen (TYLENOL) 500 MG tablet Take 200 mg by mouth 2 (two) times daily as needed for moderate pain or headache.    [provider]  calcium-vitamin D (OSCAL WITH D) 500-200 MG-UNIT per tablet Take 1 tablet by mouth daily with breakfast.    [provider]  ciprofloxacin (CIPRO) 500 MG tablet Take 1 tablet (500 mg total) by mouth 2 (two) times daily. 09/14/15   Melton Krebs, PA-C  HYDROcodone-acetaminophen (NORCO/VICODIN) 5-325 MG tablet Take 1-2 tablets by mouth every 6 (six) hours as needed. 09/14/15   Melton Krebs, PA-C  metroNIDAZOLE (FLAGYL) 500 MG tablet Take 1 tablet (500 mg total) by mouth 2 (two) times daily. 09/14/15   Melton Krebs, PA-C  Multiple Vitamin (MULTIVITAMIN WITH MINERALS) TABS tablet Take 1 tablet by mouth daily.    [provider]      Allergies    Patient has no known allergies.    Review of Systems   Review of Systems  Physical Exam Updated Vital Signs Ht 5\' 9"  (1.753 m)   Wt 102.1 kg   BMI 33.24 kg/m  Physical  Exam Vitals and nursing note reviewed.  Constitutional:      General: She is not in acute distress.    Appearance: She is well-developed.  HENT:     Head: Normocephalic and atraumatic.     Mouth/Throat:     Mouth: Mucous membranes are moist.  Eyes:     General: Vision grossly intact. Gaze aligned appropriately.     Extraocular Movements: Extraocular movements intact.     Conjunctiva/sclera: Conjunctivae normal.  Cardiovascular:     Rate and Rhythm: Normal rate and regular rhythm.     Pulses: Normal pulses.     Heart sounds: Normal heart sounds, S1 normal and S2 normal. No murmur heard.    No friction rub. No gallop.  Pulmonary:     Effort: Pulmonary effort is normal. No respiratory distress.     Breath sounds: Normal breath sounds.  Abdominal:     General: Bowel sounds are normal.     Palpations: Abdomen is soft.     Tenderness: There is no abdominal tenderness. There is no guarding or rebound.     Hernia: No hernia is present.  Musculoskeletal:        General: No swelling.     Cervical back: Full passive range of motion without pain, normal range of motion and neck supple. No spinous process tenderness or muscular tenderness. Normal range of motion.  Right lower leg: No edema.     Left lower leg: No edema.  Skin:    General: Skin is warm and dry.     Capillary Refill: Capillary refill takes less than 2 seconds.     Findings: No ecchymosis, erythema, rash or wound.  Neurological:     General: No focal deficit present.     Mental Status: She is alert and oriented to person, place, and time.     GCS: GCS eye subscore is 4. GCS verbal subscore is 5. GCS motor subscore is 6.     Cranial Nerves: Cranial nerves 2-12 are intact.     Sensory: Sensation is intact.     Motor: Motor function is intact.     Coordination: Coordination is intact.  Psychiatric:        Attention and Perception: Attention normal.        Mood and Affect: Mood normal.        Speech: Speech normal.         Behavior: Behavior normal.     ED Results / Procedures / Treatments   Labs (all labs ordered are listed, but only abnormal results are displayed) Labs Reviewed - No data to display  EKG None  Radiology No results found.  Procedures Procedures  {Document cardiac monitor, telemetry assessment procedure when appropriate:1}  Medications Ordered in ED Medications - No data to display  ED Course/ Medical Decision Making/ A&P   {   Click here for ABCD2, HEART and other calculatorsREFRESH Note before signing :1}                              Medical Decision Making Amount and/or Complexity of Data Reviewed Labs: ordered. Radiology: ordered.   ***  {Document critical care time when appropriate:1} {Document review of labs and clinical decision tools ie heart score, Chads2Vasc2 etc:1}  {Document your independent review of radiology images, and any outside records:1} {Document your discussion with family members, caretakers, and with consultants:1} {Document social determinants of health affecting pt's care:1} {Document your decision making why or why not admission, treatments were needed:1} Final Clinical Impression(s) / ED Diagnoses Final diagnoses:  None    Rx / DC Orders ED Discharge Orders     None

## 2023-02-28 ENCOUNTER — Emergency Department (HOSPITAL_COMMUNITY): Payer: Medicare HMO

## 2023-02-28 LAB — CBC WITH DIFFERENTIAL/PLATELET
Abs Immature Granulocytes: 0.02 10*3/uL (ref 0.00–0.07)
Basophils Absolute: 0 10*3/uL (ref 0.0–0.1)
Basophils Relative: 1 %
Eosinophils Absolute: 0.3 10*3/uL (ref 0.0–0.5)
Eosinophils Relative: 4 %
HCT: 37 % (ref 36.0–46.0)
Hemoglobin: 11.5 g/dL — ABNORMAL LOW (ref 12.0–15.0)
Immature Granulocytes: 0 %
Lymphocytes Relative: 39 %
Lymphs Abs: 2.7 10*3/uL (ref 0.7–4.0)
MCH: 27.3 pg (ref 26.0–34.0)
MCHC: 31.1 g/dL (ref 30.0–36.0)
MCV: 87.9 fL (ref 80.0–100.0)
Monocytes Absolute: 0.6 10*3/uL (ref 0.1–1.0)
Monocytes Relative: 8 %
Neutro Abs: 3.3 10*3/uL (ref 1.7–7.7)
Neutrophils Relative %: 48 %
Platelets: 227 10*3/uL (ref 150–400)
RBC: 4.21 MIL/uL (ref 3.87–5.11)
RDW: 13.7 % (ref 11.5–15.5)
WBC: 6.9 10*3/uL (ref 4.0–10.5)
nRBC: 0 % (ref 0.0–0.2)

## 2023-02-28 LAB — URINALYSIS, ROUTINE W REFLEX MICROSCOPIC
Bilirubin Urine: NEGATIVE
Glucose, UA: NEGATIVE mg/dL
Hgb urine dipstick: NEGATIVE
Ketones, ur: NEGATIVE mg/dL
Leukocytes,Ua: NEGATIVE
Nitrite: NEGATIVE
Protein, ur: NEGATIVE mg/dL
Specific Gravity, Urine: 1.017 (ref 1.005–1.030)
pH: 5 (ref 5.0–8.0)

## 2023-02-28 LAB — COMPREHENSIVE METABOLIC PANEL
ALT: 15 U/L (ref 0–44)
AST: 22 U/L (ref 15–41)
Albumin: 3.5 g/dL (ref 3.5–5.0)
Alkaline Phosphatase: 82 U/L (ref 38–126)
Anion gap: 13 (ref 5–15)
BUN: 26 mg/dL — ABNORMAL HIGH (ref 8–23)
CO2: 21 mmol/L — ABNORMAL LOW (ref 22–32)
Calcium: 9.1 mg/dL (ref 8.9–10.3)
Chloride: 108 mmol/L (ref 98–111)
Creatinine, Ser: 1.19 mg/dL — ABNORMAL HIGH (ref 0.44–1.00)
GFR, Estimated: 46 mL/min — ABNORMAL LOW (ref >60)
Glucose, Bld: 108 mg/dL — ABNORMAL HIGH (ref 70–99)
Potassium: 4 mmol/L (ref 3.5–5.1)
Sodium: 142 mmol/L (ref 135–145)
Total Bilirubin: 0.8 mg/dL (ref <1.2)
Total Protein: 6.3 g/dL — ABNORMAL LOW (ref 6.5–8.1)

## 2023-09-09 ENCOUNTER — Emergency Department (HOSPITAL_COMMUNITY)

## 2023-09-09 ENCOUNTER — Other Ambulatory Visit: Payer: Self-pay

## 2023-09-09 ENCOUNTER — Inpatient Hospital Stay (HOSPITAL_COMMUNITY)

## 2023-09-09 ENCOUNTER — Observation Stay (HOSPITAL_COMMUNITY)
Admission: EM | Admit: 2023-09-09 | Discharge: 2023-09-10 | Disposition: A | Discharge status: Home / Self Care | Attending: Internal Medicine | Admitting: Internal Medicine

## 2023-09-09 ENCOUNTER — Encounter (HOSPITAL_COMMUNITY): Payer: Self-pay

## 2023-09-09 DIAGNOSIS — Z7902 Long term (current) use of antithrombotics/antiplatelets: Secondary | ICD-10-CM | POA: Diagnosis not present

## 2023-09-09 DIAGNOSIS — Z8739 Personal history of other diseases of the musculoskeletal system and connective tissue: Secondary | ICD-10-CM | POA: Diagnosis not present

## 2023-09-09 DIAGNOSIS — M25512 Pain in left shoulder: Secondary | ICD-10-CM | POA: Insufficient documentation

## 2023-09-09 DIAGNOSIS — I639 Cerebral infarction, unspecified: Principal | ICD-10-CM

## 2023-09-09 DIAGNOSIS — I6389 Other cerebral infarction: Secondary | ICD-10-CM | POA: Diagnosis not present

## 2023-09-09 DIAGNOSIS — G894 Chronic pain syndrome: Secondary | ICD-10-CM | POA: Diagnosis not present

## 2023-09-09 DIAGNOSIS — Z7982 Long term (current) use of aspirin: Secondary | ICD-10-CM | POA: Diagnosis not present

## 2023-09-09 DIAGNOSIS — I10 Essential (primary) hypertension: Secondary | ICD-10-CM | POA: Insufficient documentation

## 2023-09-09 DIAGNOSIS — M25562 Pain in left knee: Secondary | ICD-10-CM | POA: Insufficient documentation

## 2023-09-09 DIAGNOSIS — M1712 Unilateral primary osteoarthritis, left knee: Secondary | ICD-10-CM | POA: Diagnosis not present

## 2023-09-09 DIAGNOSIS — M25569 Pain in unspecified knee: Secondary | ICD-10-CM | POA: Insufficient documentation

## 2023-09-09 DIAGNOSIS — R531 Weakness: Secondary | ICD-10-CM | POA: Diagnosis present

## 2023-09-09 LAB — PROTIME-INR
INR: 1 (ref 0.8–1.2)
Prothrombin Time: 14.2 s (ref 11.4–15.2)

## 2023-09-09 LAB — DIFFERENTIAL
Abs Immature Granulocytes: 0.02 10*3/uL (ref 0.00–0.07)
Basophils Absolute: 0 10*3/uL (ref 0.0–0.1)
Basophils Relative: 1 %
Eosinophils Absolute: 0.2 10*3/uL (ref 0.0–0.5)
Eosinophils Relative: 4 %
Immature Granulocytes: 0 %
Lymphocytes Relative: 41 %
Lymphs Abs: 2.4 10*3/uL (ref 0.7–4.0)
Monocytes Absolute: 0.5 10*3/uL (ref 0.1–1.0)
Monocytes Relative: 8 %
Neutro Abs: 2.7 10*3/uL (ref 1.7–7.7)
Neutrophils Relative %: 46 %

## 2023-09-09 LAB — COMPREHENSIVE METABOLIC PANEL WITH GFR
ALT: 23 U/L (ref 0–44)
AST: 28 U/L (ref 15–41)
Albumin: 3.9 g/dL (ref 3.5–5.0)
Alkaline Phosphatase: 85 U/L (ref 38–126)
Anion gap: 10 (ref 5–15)
BUN: 20 mg/dL (ref 8–23)
CO2: 24 mmol/L (ref 22–32)
Calcium: 9.3 mg/dL (ref 8.9–10.3)
Chloride: 108 mmol/L (ref 98–111)
Creatinine, Ser: 0.91 mg/dL (ref 0.44–1.00)
GFR, Estimated: >60 mL/min (ref >60)
Glucose, Bld: 104 mg/dL — ABNORMAL HIGH (ref 70–99)
Potassium: 4 mmol/L (ref 3.5–5.1)
Sodium: 142 mmol/L (ref 135–145)
Total Bilirubin: 0.8 mg/dL (ref 0.0–1.2)
Total Protein: 6.7 g/dL (ref 6.5–8.1)

## 2023-09-09 LAB — APTT: aPTT: 32 s (ref 24–36)

## 2023-09-09 LAB — CBC
HCT: 39 % (ref 36.0–46.0)
Hemoglobin: 12.2 g/dL (ref 12.0–15.0)
MCH: 27.3 pg (ref 26.0–34.0)
MCHC: 31.3 g/dL (ref 30.0–36.0)
MCV: 87.2 fL (ref 80.0–100.0)
Platelets: 223 10*3/uL (ref 150–400)
RBC: 4.47 MIL/uL (ref 3.87–5.11)
RDW: 14.1 % (ref 11.5–15.5)
WBC: 5.9 10*3/uL (ref 4.0–10.5)
nRBC: 0 % (ref 0.0–0.2)

## 2023-09-09 LAB — CBG MONITORING, ED: Glucose-Capillary: 116 mg/dL — ABNORMAL HIGH (ref 70–99)

## 2023-09-09 LAB — ETHANOL: Alcohol, Ethyl (B): <15 mg/dL (ref <15)

## 2023-09-09 MED ORDER — SODIUM CHLORIDE 0.9% FLUSH
3.0000 mL | Freq: Two times a day (BID) | INTRAVENOUS | Status: DC
  Administered 2023-09-09 – 2023-09-10 (×2): 10 mL via INTRAVENOUS

## 2023-09-09 MED ORDER — SODIUM CHLORIDE 0.9% FLUSH: 3.0000 mL | INTRAVENOUS | Status: DC | PRN

## 2023-09-09 MED ORDER — HYDRALAZINE HCL 20 MG/ML IJ SOLN
10.0000 mg | Freq: Four times a day (QID) | INTRAMUSCULAR | Status: DC | PRN
  Administered 2023-09-10 (×2): 10 mg via INTRAVENOUS
  Filled 2023-09-09 (×2): qty 1

## 2023-09-09 MED ORDER — ASPIRIN 325 MG PO TABS: 325.0000 mg | ORAL_TABLET | Freq: Once | ORAL | Status: DC

## 2023-09-09 MED ORDER — STROKE: EARLY STAGES OF RECOVERY BOOK
Freq: Once | Status: AC
  Filled 2023-09-09 (×2): qty 1

## 2023-09-09 MED ORDER — ASPIRIN 81 MG PO TBEC
81.0000 mg | DELAYED_RELEASE_TABLET | Freq: Every day | ORAL | Status: DC
  Administered 2023-09-10: 81 mg via ORAL
  Filled 2023-09-09: qty 1

## 2023-09-09 MED ORDER — CLOPIDOGREL BISULFATE 75 MG PO TABS
75.0000 mg | ORAL_TABLET | Freq: Every day | ORAL | Status: DC
  Administered 2023-09-10: 75 mg via ORAL
  Filled 2023-09-09: qty 1

## 2023-09-09 MED ORDER — IOHEXOL 350 MG/ML SOLN
75.0000 mL | Freq: Once | INTRAVENOUS | Status: AC | PRN
  Administered 2023-09-09: 75 mL via INTRAVENOUS

## 2023-09-09 MED ORDER — ENOXAPARIN SODIUM 40 MG/0.4ML IJ SOSY
40.0000 mg | PREFILLED_SYRINGE | INTRAMUSCULAR | Status: DC
  Administered 2023-09-09: 40 mg via SUBCUTANEOUS
  Filled 2023-09-09: qty 0.4

## 2023-09-09 MED ORDER — CLOPIDOGREL BISULFATE 300 MG PO TABS
300.0000 mg | ORAL_TABLET | Freq: Once | ORAL | Status: AC
  Administered 2023-09-09: 300 mg via ORAL
  Filled 2023-09-09: qty 1

## 2023-09-09 MED ORDER — ATORVASTATIN CALCIUM 10 MG PO TABS
20.0000 mg | ORAL_TABLET | Freq: Every day | ORAL | Status: DC
  Administered 2023-09-09 – 2023-09-10 (×2): 20 mg via ORAL
  Filled 2023-09-09 (×2): qty 2

## 2023-09-09 MED ORDER — LORAZEPAM 0.5 MG PO TABS
0.5000 mg | ORAL_TABLET | Freq: Once | ORAL | Status: AC
  Administered 2023-09-09: 0.5 mg via ORAL
  Filled 2023-09-09: qty 1

## 2023-09-09 MED ORDER — ASPIRIN 81 MG PO CHEW
324.0000 mg | CHEWABLE_TABLET | Freq: Once | ORAL | Status: AC
  Administered 2023-09-09: 324 mg via ORAL
  Filled 2023-09-09: qty 4

## 2023-09-09 MED ORDER — ACETAMINOPHEN 325 MG PO TABS
650.0000 mg | ORAL_TABLET | ORAL | Status: DC | PRN
  Administered 2023-09-10: 650 mg via ORAL
  Filled 2023-09-09: qty 2

## 2023-09-09 MED ORDER — ACETAMINOPHEN 160 MG/5ML PO SOLN: 650.0000 mg | ORAL | Status: DC | PRN

## 2023-09-09 MED ORDER — SENNOSIDES-DOCUSATE SODIUM 8.6-50 MG PO TABS: 1.0000 | ORAL_TABLET | Freq: Every evening | ORAL | Status: DC | PRN

## 2023-09-09 MED ORDER — ACETAMINOPHEN 650 MG RE SUPP: 650.0000 mg | RECTAL | Status: DC | PRN

## 2023-09-09 NOTE — ED Provider Notes (Signed)
 [place] EMERGENCY DEPARTMENT AT [facility] Provider Note   CSN: [id number] Arrival date & time: 09/09/23  1617     Patient presents with: Stroke Like Symptoms   Courtney Holmes is a 83 y.o. female.   83 year old female with prior medical history as detailed below presents for evaluation.  Patient complains of intermittent left shoulder pain x 1 to 2 weeks.  She has been seen by her PCP and diagnosed with possible arthritis versus possible rotator cuff injury.  Over the last 2 to 3 days, patient has also noticed intermittent left arm and left leg tingling and weakness.  She reports that the symptoms are currently improved.    She reports a history of mini strokes in the past.  These were diagnosed and treated in [place]  prior to moving [place].  She is not currently taking any daily medications.  She denies any other medical problems such as hypertension, diabetes, etc.  The history is provided by the patient.       Prior to Admission medications   Medication Sig Start Date End Date Taking? Authorizing Provider  acetaminophen  (TYLENOL ) 500 MG tablet Take 200 mg by mouth 2 (two) times daily as needed for moderate pain or headache.    [provider]  calcium-vitamin D (OSCAL WITH D) 500-200 MG-UNIT per tablet Take 1 tablet by mouth daily with breakfast.    [provider]  ciprofloxacin  (CIPRO ) 500 MG tablet Take 1 tablet (500 mg total) by mouth 2 (two) times daily. 09/14/15   Carlo Lucie Garre, PA-C  HYDROcodone -acetaminophen  (NORCO/VICODIN) 5-325 MG tablet Take 1-2 tablets by mouth every 6 (six) hours as needed. 09/14/15   Carlo Lucie Garre, PA-C  metroNIDAZOLE  (FLAGYL ) 500 MG tablet Take 1 tablet (500 mg total) by mouth 2 (two) times daily. 09/14/15   Carlo Lucie Garre, PA-C  Multiple Vitamin (MULTIVITAMIN WITH MINERALS) TABS tablet Take 1 tablet by mouth daily.    [provider]    Allergies: Patient has no known allergies.     Review of Systems  All other systems reviewed and are negative.   Updated Vital Signs BP (!) 167/69   Pulse 84   Temp 98.7 F (37.1 C)   Resp 15   Ht 5' 9 (1.753 m)   Wt 102.1 kg   SpO2 96%   BMI 33.24 kg/m   Physical Exam Vitals and nursing note reviewed.  Constitutional:      General: She is not in acute distress.    Appearance: Normal appearance. She is well-developed.  HENT:     Head: Normocephalic and atraumatic.  Eyes:     Conjunctiva/sclera: Conjunctivae normal.     Pupils: Pupils are equal, round, and reactive to light.  Cardiovascular:     Rate and Rhythm: Normal rate and regular rhythm.     Heart sounds: Normal heart sounds.  Pulmonary:     Effort: Pulmonary effort is normal. No respiratory distress.     Breath sounds: Normal breath sounds.  Abdominal:     General: There is no distension.     Palpations: Abdomen is soft.     Tenderness: There is no abdominal tenderness.  Musculoskeletal:        General: No deformity. Normal range of motion.     Cervical back: Normal range of motion and neck supple.  Skin:    General: Skin is warm and dry.  Neurological:     General: No focal deficit present.     Mental  Status: She is alert and oriented to person, place, and time.     (all labs ordered are listed, but only abnormal results are displayed) Labs Reviewed  CBG MONITORING, ED - Abnormal; Notable for the following components:      Result Value   Glucose-Capillary 116 (*)    All other components within normal limits  PROTIME-INR  APTT  CBC  DIFFERENTIAL  COMPREHENSIVE METABOLIC PANEL WITH GFR  ETHANOL  CBG MONITORING, ED  I-STAT CHEM 8, ED    EKG: EKG Interpretation Date/Time:  Thursday September 09 2023 16:28:40 EDT Ventricular Rate:  73 PR Interval:  159 QRS Duration:  96 QT Interval:  394 QTC Calculation: 435 R Axis:   1  Text Interpretation: Sinus rhythm Consider anterior infarct Confirmed by Laurice Coy [phone]) on 09/09/2023 4:47:57  PM  Radiology: No results found.   Procedures   Medications Ordered in the ED - No data to display                                  Medical Decision Making Amount and/or Complexity of Data Reviewed Labs: ordered. Radiology: ordered.  Risk Prescription drug management.    Medical Screen Complete  This patient presented to the ED with complaint of weakness.  This complaint involves an extensive number of treatment options. The initial differential diagnosis includes, but is not limited to, CVA, metabolic abnormality, etc.  This presentation is: Acute, Self-Limited, Previously Undiagnosed, Uncertain Prognosis, Complicated, Systemic Symptoms, and Threat to Life/Bodily Function Patient is presenting with complaint of intermittent left arm and left leg weakness.  Patient with symptoms for at least 2 to 3 days per her report.  Patient reports prior history of mini strokes previously.  She is not currently on any medications.  Patient is grossly intact on exam.  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  Case discussed with neurology.  Patient would benefit from admission.  Neurology would prefer that the patient be admitted to [facility].  Hospitalist service made aware of case.   Additional history obtained: External records from outside sources obtained and reviewed including prior ED visits and prior Inpatient records.    Problem List / ED Course:  CVA Disposition:  After consideration of the diagnostic results and the patients response to treatment, I feel that the patent would benefit from admission.  CRITICAL CARE Performed by: Coy JAYSON Laurice   Total critical care time: 30 minutes  Critical care time was exclusive of separately billable procedures and treating other patients.  Critical care was necessary to treat or prevent imminent or life-threatening deterioration.  Critical care was time spent personally by me on the following  activities: development of treatment plan with patient and/or surrogate as well as nursing, discussions with consultants, evaluation of patient's response to treatment, examination of patient, obtaining history from patient or surrogate, ordering and performing treatments and interventions, ordering and review of laboratory studies, ordering and review of radiographic studies, pulse oximetry and re-evaluation of patient's condition.        Final diagnoses:  Cerebrovascular accident (CVA), unspecified mechanism [facility])    ED Discharge Orders     None          Laurice Coy JAYSON, MD 09/09/23 2043

## 2023-09-09 NOTE — ED Triage Notes (Signed)
 Pt reports L shoulder pain and L knee pain along with decreased sensation. LKN 4 days ago. Pt seen by PCP and sent for imaging to r/o rotator cuff injury. No other neuro sxs.

## 2023-09-09 NOTE — H&P (Signed)
 History and Physical    Courtney Holmes FMW:[id number] DOB: 1940-04-20 DOA: 09/09/2023  PCP: Joshua Francisco, MD   Patient coming from: Home   Chief Complaint:  Chief Complaint  Patient presents with   Stroke Like Symptoms   ED TRIAGE note:  Pt reports L shoulder pain and L knee pain along with decreased sensation. LKN 4 days ago. Pt seen by PCP and sent for imaging to r/o rotator cuff injury. No other neuro sxs.             HPI:  Courtney Holmes is a 83 y.o. female with medical history significant of CVA, chronic pain syndrome and chronic osteoarthritis presented to emergency department complaining of left-sided shoulder pain and left-sided knee pain with decreased sensation over the course of last 4 days.  Patient has been seen primary care physician who sent to ED for concern for rotator cuff injury. Patient reported that she has intermittent left-sided shoulder pain for 1 to 2 weeks and diagnosed with possible arthritis with rotator cuff injury. Over the last 2 to 3 days patient also has intermittent left arm and left leg tingling and weakness.   During my evaluation at the bedside physical exam revealed left-sided upper and lower extremity muscle weakness and decreased muscle strength as compared to right side and patient also has some tingling and numbness sensation of the fingertips of the left hand.  Patient also reported feeling light to touch sensation on the left-sided upper and lower extremities as compared to right side.  Denies any dysarthria, dysphagia, dizziness, headache, blurry vision, syncope, presyncope, seizure or loss of consciousness.  Denies any chest pain, palpitation, orthopnea, lower extremity swelling, shortness of breath and cough.  Patient reported she has a history of mini stroke in the past that was diagnosed and treated in [place]  prior to moving to [place] .  Patient does not have any other past medical history and does not take any medications at  home.  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  ED physician discussed case with on-call neurology who recommended admit to [facility].   ED Course:  Presentation to ED patient found to be hypertensive blood pressure 167/69 otherwise hemodynamically stable.  CBC and CMP unremarkable.  Blood alcohol within normal range.  Normal APTT INR.  EKG showed normal sinus rhythm heart rate 73.   CT angio head and neck: CT angio head and neck no large vessel occlusion. 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  No CT evidence of acute intracranial abnormality. Infarct in the right basal ganglia noted on MRI is not well visualized on the current study.  MRI of the brain showed small acute or early subacute infarction in the right basal ganglia.  Hospitalist has been consulted for further evaluation management of acute CVA, left shoulder pain and left knee joint pain.   Significant labs in the ED: Lab Orders         Protime-INR         APTT         CBC         Differential         Comprehensive metabolic panel         Ethanol         CBC         Hemoglobin A1c  Lipid panel         Basic metabolic panel         CBG monitoring, ED         I-stat chem 8, ED         CBG monitoring, ED       Review of Systems:  Review of Systems  Constitutional:  Negative for chills, fever, malaise/fatigue and weight loss.  Respiratory:  Negative for cough, hemoptysis and shortness of breath.   Cardiovascular:  Negative for chest pain, palpitations and orthopnea.  Gastrointestinal:  Negative for heartburn, nausea and vomiting.  Musculoskeletal:  Positive for joint pain. Negative for back pain, falls, myalgias and neck pain.  Neurological:  Positive for tingling and focal weakness. Negative for dizziness, tremors, sensory change, speech change, seizures,  loss of consciousness, weakness and headaches.  Psychiatric/Behavioral:  The patient is not nervous/anxious.     Past Medical History:  Diagnosis Date   Hypertension     Past Surgical History:  Procedure Laterality Date   TUBAL LIGATION       reports that she has never smoked. She does not have any smokeless tobacco history on file. She reports that she does not drink alcohol and does not use drugs.  No Known Allergies  History reviewed. No pertinent family history.  Prior to Admission medications   Medication Sig Start Date End Date Taking? Authorizing Provider  acetaminophen  (TYLENOL ) 500 MG tablet Take 500 mg by mouth 2 (two) times daily as needed for mild pain (pain score 1-3), moderate pain (pain score 4-6) or headache.   Yes [provider]  ciprofloxacin  (CIPRO ) 500 MG tablet Take 1 tablet (500 mg total) by mouth 2 (two) times daily. Patient not taking: Reported on 09/09/2023 09/14/15   Carlo Lucie Garre, PA-C  HYDROcodone -acetaminophen  (NORCO/VICODIN) 5-325 MG tablet Take 1-2 tablets by mouth every 6 (six) hours as needed. Patient not taking: Reported on 09/09/2023 09/14/15   Carlo Lucie Garre, PA-C  metroNIDAZOLE  (FLAGYL ) 500 MG tablet Take 1 tablet (500 mg total) by mouth 2 (two) times daily. Patient not taking: Reported on 09/09/2023 09/14/15   Carlo Lucie Garre, [place]     Physical Exam: Vitals:   09/09/23 1810 09/09/23 1811 09/09/23 1817 09/09/23 2010  BP:    (!) 147/120  Pulse: 66 62 63 74  Resp: 18 19 19  (!) 22  Temp:    98.8 F (37.1 C)  SpO2: 96% 97% 94% 94%  Weight:      Height:        Physical Exam Vitals and nursing note reviewed.  HENT:     Head: Normocephalic and atraumatic.     Nose: Nose normal.     Mouth/Throat:     Mouth: Mucous membranes are moist.  Eyes:     Pupils: Pupils are equal, round, and reactive to light.  Cardiovascular:     Rate and Rhythm: Normal rate and regular rhythm.     Pulses: Normal pulses.     Heart  sounds: Normal heart sounds.  Abdominal:     Palpations: Abdomen is soft.  Musculoskeletal:        General: No swelling, tenderness, deformity or signs of injury. Normal range of motion.     Cervical back: Neck supple.     Right lower leg: No edema.     Left lower leg: No edema.  Skin:    General: Skin is warm.     Capillary Refill: Capillary refill takes less than 2 seconds.  Neurological:     Mental Status: She is alert and oriented to person, place, and time.     Cranial Nerves: No cranial nerve deficit.     Sensory: Sensory deficit present.     Motor: Weakness present.     Coordination: Coordination normal.     Comments: Left-sided upper and lower extremity muscle strength 4/5. Right-sided upper EXTR lower extremity muscle strength 5/5.  Psychiatric:        Mood and Affect: Mood normal.        Thought Content: Thought content normal.        Judgment: Judgment normal.      Labs on Admission: I have personally reviewed following labs and imaging studies  CBC: Recent Labs  Lab 09/09/23 1734  WBC 5.9  NEUTROABS 2.7  HGB 12.2  HCT 39.0  MCV 87.2  PLT 223   Basic Metabolic Panel: Recent Labs  Lab 09/09/23 1734  NA 142  K 4.0  CL 108  CO2 24  GLUCOSE 104*  BUN 20  CREATININE 0.91  CALCIUM 9.3   GFR: Estimated Creatinine Clearance: 59.6 mL/min (by C-G formula based on SCr of 0.91 mg/dL). Liver Function Tests: Recent Labs  Lab 09/09/23 1734  AST 28  ALT 23  ALKPHOS 85  BILITOT 0.8  PROT 6.7  ALBUMIN 3.9   No results for input(s): LIPASE, AMYLASE in the last 168 hours. No results for input(s): AMMONIA in the last 168 hours. Coagulation Profile: Recent Labs  Lab 09/09/23 1734  INR 1.0   Cardiac Enzymes: No results for input(s): CKTOTAL, CKMB, CKMBINDEX, TROPONINI, TROPONINIHS in the last 168 hours. BNP (last 3 results) No results for input(s): BNP in the last 8760 hours. HbA1C: No results for input(s): HGBA1C in the last 72  hours. CBG: Recent Labs  Lab 09/09/23 1624  GLUCAP 116*   Lipid Profile: No results for input(s): CHOL, HDL, LDLCALC, TRIG, CHOLHDL, LDLDIRECT in the last 72 hours. Thyroid Function Tests: No results for input(s): TSH, T4TOTAL, FREET4, T3FREE, THYROIDAB in the last 72 hours. Anemia Panel: No results for input(s): VITAMINB12, FOLATE, FERRITIN, TIBC, IRON, RETICCTPCT in the last 72 hours. Urine analysis:    Component Value Date/Time   COLORURINE YELLOW 02/27/2023 2345   APPEARANCEUR CLEAR 02/27/2023 2345   LABSPEC 1.017 02/27/2023 2345   PHURINE 5.0 02/27/2023 2345   GLUCOSEU NEGATIVE 02/27/2023 2345   HGBUR NEGATIVE 02/27/2023 2345   BILIRUBINUR NEGATIVE 02/27/2023 2345   KETONESUR NEGATIVE 02/27/2023 2345   PROTEINUR NEGATIVE 02/27/2023 2345   NITRITE NEGATIVE 02/27/2023 2345   LEUKOCYTESUR NEGATIVE 02/27/2023 2345    Radiological Exams on Admission: I have personally reviewed images DG Knee 1-2 Views Left Result Date: 09/09/2023 CLINICAL DATA:  Knot on the knee, uncontrollable twitching and moving EXAM: LEFT KNEE - 1-2 VIEW COMPARISON:  None Available. FINDINGS: Osteopenia.No acute fracture or dislocation. Trace joint effusion. Moderate tricompartmental joint space loss with osteophyte formation. Quadriceps insertion enthesophyte. Soft tissues are unremarkable. IMPRESSION: 1. Trace joint effusion.  No acute fracture or dislocation. 2. Moderate tricompartmental osteoarthritis of the knee. Electronically Signed   By: Rogelia Myers M.D.   On: 09/09/2023 21:26   DG Shoulder Left Result Date: 09/09/2023 CLINICAL DATA:  144754 Shoulder pain 144754 EXAM: LEFT SHOULDER - 2+ VIEW COMPARISON:  October 26, 2014 FINDINGS: Osteopenia.No acute fracture or dislocation. Moderate AC joint space loss, unchanged. Apparent cardiomegaly. IMPRESSION: 1. Osteopenia.  No acute fracture or dislocation. 2. Moderate osteoarthritis of the AC joint. Electronically Signed  By:  Rogelia Myers M.D.   On: 09/09/2023 21:24   CT ANGIO HEAD NECK W WO CM Result Date: 09/09/2023 CLINICAL DATA:  Transient ischemic attack, decreased sensation. Acute versus early subacute infarct in the right basal ganglia. EXAM: CT ANGIOGRAPHY HEAD AND NECK WITH AND WITHOUT CONTRAST TECHNIQUE: Multidetector CT imaging of the head and neck was performed using the standard protocol during bolus administration of intravenous contrast. Multiplanar CT image reconstructions and MIPs were obtained to evaluate the vascular anatomy. Carotid stenosis measurements (when applicable) are obtained utilizing NASCET criteria, using the distal internal carotid diameter as the denominator. RADIATION DOSE REDUCTION: This exam was performed according to the departmental dose-optimization program which includes automated exposure control, adjustment of the mA and/or kV according to patient size and/or use of iterative reconstruction technique. CONTRAST:  75mL OMNIPAQUE IOHEXOL 350 MG/ML SOLN COMPARISON:  MRI head 09/09/2023. FINDINGS: CT HEAD FINDINGS Brain: No acute intracranial hemorrhage. No CT evidence of acute infarct. No edema, mass effect, or midline shift. The basilar cisterns are patent. Ventricles: The ventricles are normal. Vascular: No hyperdense vessel or unexpected calcification. Skull: No acute or aggressive finding. Orbits: Bilateral lens replacement. Sinuses: Mild mucosal thickening in the ethmoid sinuses. Other: Mastoid air cells are clear. CTA NECK FINDINGS Aortic arch: Common origin of the brachiocephalic and left common carotid arteries. Imaged portion shows no evidence of aneurysm or dissection. Mild atherosclerosis of the visualized aortic arch. No significant stenosis of the major arch vessel origins. Pulmonary arteries: As permitted by contrast timing, there are no filling defects in the visualized pulmonary arteries. Subclavian arteries: The subclavian arteries are patent bilaterally. Right carotid system:  No evidence of dissection, stenosis (50% or greater), or occlusion. Tortuosity of the proximal common carotid artery. Mild atherosclerosis along the proximal cervical ICA without hemodynamically significant stenosis. Mild tortuosity of the cervical ICA. Left carotid system: No evidence of dissection, stenosis (50% or greater), or occlusion. Calcified and noncalcified atherosclerotic plaque at the carotid bifurcation without hemodynamically significant stenosis. Vertebral arteries: Codominant. No evidence of dissection, stenosis (50% or greater), or occlusion. Skeleton: No acute findings. Degenerative changes in the cervical spine. Edentulous maxilla with associated chronic bone loss. Other neck: The visualized airway is patent. No cervical lymphadenopathy. Upper chest: Visualized lung apices are clear. Review of the MIP images confirms the above findings CTA HEAD FINDINGS ANTERIOR CIRCULATION: The intracranial internal carotid arteries are patent bilaterally. Minimal atherosclerosis of the carotid siphons. No significant stenosis. MCAs: The middle cerebral arteries are patent bilaterally. ACAs: The anterior cerebral arteries are patent bilaterally. POSTERIOR CIRCULATION: No significant stenosis, proximal occlusion, aneurysm, or vascular malformation. PCAs: Patent bilaterally. Long segment mild-to-moderate stenosis of the left P1 segment. Additional irregularity and moderate stenosis of the left P2 segment. Pcomm: Not well visualized. SCAs: The superior cerebellar arteries are patent bilaterally. Basilar artery: Patent AICAs: Visualized on the right. PICAs: Visualized on the left. Vertebral arteries: The intracranial vertebral arteries are patent. Venous sinuses: As permitted by contrast timing, patent. Anatomic variants: None Review of the MIP images confirms the above findings IMPRESSION: No large vessel occlusion. Mild-to-moderate long segment stenosis of the P1 segment of the left PCA and additional moderate  stenosis of the P2 segment left PCA. No CT evidence of acute intracranial abnormality. Infarct in the right basal ganglia noted on MRI is not well visualized on the current study. Electronically Signed   By: Donnice Mania M.D.   On: 09/09/2023 20:44   MR BRAIN WO CONTRAST Result Date: 09/09/2023 CLINICAL DATA:  Transient ischemic attack (TIA) Left arm / leg weakness - intermittent x 4 days EXAM: MRI HEAD WITHOUT CONTRAST TECHNIQUE: Multiplanar, multiecho pulse sequences of the brain and surrounding structures were obtained without intravenous contrast. COMPARISON:  CT head 02/28/2023. FINDINGS: Brain: Small acute or early subacute infarct in the right basal ganglia (series 5, image 31). Mild associated edema without mass effect. No midline shift, acute hemorrhage, mass lesion or hydrocephalus. Vascular: Major arterial flow voids are maintained at the skull base. Skull and upper cervical spine: Normal marrow signal. Sinuses/Orbits: Mild paranasal sinus mucosal thickening. No acute orbital findings. 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 Electronically Signed   By: Gilmore GORMAN Molt M.D.   On: 09/09/2023 20:01     EKG: My personal interpretation of EKG shows: EKG showed normal sinus rhythm heart rate 73    Assessment/Plan: Principal Problem:   Acute CVA (cerebrovascular accident) [facility]) Active Problems:   Left shoulder pain   Knee joint pain   Chronic pain syndrome    Assessment and Plan: Acute ischemic CVA History of previous CVA -Presenting to emergency department complaining of chronic left-sided shoulder and knee joint pain went to see primary care physician referred to ED for evaluation for joint pain.  Patient also noticed left-sided upper and lower extremity weakness as compared to right side with associated left hand tingling and numbness for last 3 to 4 days.  Last known well 4 days ago. -At presentation to the ED hemodynamically stable.  CBC and  CMP unremarkable.  ED show normal sinus rhythm heart rate 73. - MRI of the brain showed acute or early subacute infarction in the right basal ganglia. -CT angio head and neck no large vessel occlusion. -Neurology Dr. Vanessa has been consulted recommended admit patient to [facility] for stroke workup. - Discussed with on-call neurology agrees to start dual antiplatelet therapy aspirin and Plavix. - Giving aspirin and Plavix load followed by continue aspirin 81 mg daily and Plavix 75 mg daily for tomorrow.  Starting low-dose Lipitor 20 mg daily. - Obtaining echocardiogram. - Continue cardiac monitoring - Checking A1c and lipid panel - Consulting inpatient PT/OT.  Given patient does not have any dysphagia or dysarthria no need for SLP therapy. - If patient passes stroke swallow skin can continue with the diet. - Continue permissive hypertension for next 24 hours goal blood pressure below 210/120. -Appreciate neurology input.   Left-sided shoulder pain Left-sided knee joint pain Chronic pain syndrome History of chronic osteoarthritis -Patient has chronic pain of the left shoulder and left-sided knee joint.   -Left-sided shoulder x-ray osteopenia, no acute fracture or dislocation.  Moderate osteoarthritis of the AC joint. -Left-sided knee joint x-ray trace joint effusion, no acute fracture or dislocation.  Moderate tricompartmental osteoarthritis of the knee. -Continue Tylenol  as needed.   DVT prophylaxis:  Lovenox Code Status:  Full Code Diet: Heart healthy diet Family Communication:   Family was present at bedside, at the time of interview. Opportunity was given to ask question and all questions were answered satisfactorily.  Disposition Plan: Need to follow-up with echocardiogram and x-ray results. Consults: Neurology Admission status:   Inpatient, Telemetry bed  Severity of Illness: The appropriate patient status for this patient is INPATIENT. Inpatient status is judged  to be reasonable and necessary in order to provide the required intensity of service to ensure the patient's safety. The patient's presenting symptoms, physical exam findings, and initial radiographic and laboratory data in the context of their  chronic comorbidities is felt to place them at high risk for further clinical deterioration. Furthermore, it is not anticipated that the patient will be medically stable for discharge from the hospital within 2 midnights of admission.   * I certify that at the point of admission it is my clinical judgment that the patient will require inpatient hospital care spanning beyond 2 midnights from the point of admission due to high intensity of service, high risk for further deterioration and high frequency of surveillance required.DEWAINE    Tisha Cline, MD [facility]  How to contact the [facility] Attending or Consulting provider 7A - 7P or covering provider during after hours 7P -7A, for this patient.  Check the care team in [facility] and look for a) attending/consulting [facility] provider listed and b) the [facility] team listed Log into [email or web address] and use [place]'s universal password to access. If you do not have the password, please contact the hospital operator. Locate the [facility] provider you are looking for under [facility] and page to a number that you can be directly reached. If you still have difficulty reaching the provider, please page the [facility] (Director on Call) for the Hospitalists listed on amion for assistance.  09/09/2023, 9:29 PM

## 2023-09-10 ENCOUNTER — Inpatient Hospital Stay (HOSPITAL_BASED_OUTPATIENT_CLINIC_OR_DEPARTMENT_OTHER)

## 2023-09-10 DIAGNOSIS — M25512 Pain in left shoulder: Secondary | ICD-10-CM | POA: Diagnosis not present

## 2023-09-10 DIAGNOSIS — R29701 NIHSS score 1: Secondary | ICD-10-CM

## 2023-09-10 DIAGNOSIS — Z7902 Long term (current) use of antithrombotics/antiplatelets: Secondary | ICD-10-CM | POA: Diagnosis not present

## 2023-09-10 DIAGNOSIS — I6389 Other cerebral infarction: Secondary | ICD-10-CM | POA: Diagnosis not present

## 2023-09-10 DIAGNOSIS — G894 Chronic pain syndrome: Secondary | ICD-10-CM | POA: Diagnosis not present

## 2023-09-10 DIAGNOSIS — R531 Weakness: Secondary | ICD-10-CM | POA: Diagnosis present

## 2023-09-10 DIAGNOSIS — I739 Peripheral vascular disease, unspecified: Secondary | ICD-10-CM

## 2023-09-10 DIAGNOSIS — I639 Cerebral infarction, unspecified: Secondary | ICD-10-CM | POA: Diagnosis not present

## 2023-09-10 DIAGNOSIS — I10 Essential (primary) hypertension: Secondary | ICD-10-CM | POA: Diagnosis not present

## 2023-09-10 DIAGNOSIS — Z7982 Long term (current) use of aspirin: Secondary | ICD-10-CM | POA: Diagnosis not present

## 2023-09-10 DIAGNOSIS — E785 Hyperlipidemia, unspecified: Secondary | ICD-10-CM

## 2023-09-10 DIAGNOSIS — M25562 Pain in left knee: Secondary | ICD-10-CM | POA: Diagnosis not present

## 2023-09-10 DIAGNOSIS — Z8739 Personal history of other diseases of the musculoskeletal system and connective tissue: Secondary | ICD-10-CM | POA: Diagnosis not present

## 2023-09-10 DIAGNOSIS — I6381 Other cerebral infarction due to occlusion or stenosis of small artery: Secondary | ICD-10-CM

## 2023-09-10 LAB — BASIC METABOLIC PANEL WITH GFR
Anion gap: 9 (ref 5–15)
BUN: 15 mg/dL (ref 8–23)
CO2: 25 mmol/L (ref 22–32)
Calcium: 8.8 mg/dL — ABNORMAL LOW (ref 8.9–10.3)
Chloride: 105 mmol/L (ref 98–111)
Creatinine, Ser: 1.04 mg/dL — ABNORMAL HIGH (ref 0.44–1.00)
GFR, Estimated: 53 mL/min — ABNORMAL LOW (ref >60)
Glucose, Bld: 138 mg/dL — ABNORMAL HIGH (ref 70–99)
Potassium: 4.2 mmol/L (ref 3.5–5.1)
Sodium: 139 mmol/L (ref 135–145)

## 2023-09-10 LAB — CBC
HCT: 37.6 % (ref 36.0–46.0)
Hemoglobin: 12.1 g/dL (ref 12.0–15.0)
MCH: 27.6 pg (ref 26.0–34.0)
MCHC: 32.2 g/dL (ref 30.0–36.0)
MCV: 85.6 fL (ref 80.0–100.0)
Platelets: 220 K/uL (ref 150–400)
RBC: 4.39 MIL/uL (ref 3.87–5.11)
RDW: 14 % (ref 11.5–15.5)
WBC: 5.6 K/uL (ref 4.0–10.5)
nRBC: 0 % (ref 0.0–0.2)

## 2023-09-10 LAB — LIPID PANEL
Cholesterol: 169 mg/dL (ref 0–200)
HDL: 46 mg/dL (ref >40)
LDL Cholesterol: 87 mg/dL (ref 0–99)
Total CHOL/HDL Ratio: 3.7 ratio
Triglycerides: 179 mg/dL — ABNORMAL HIGH (ref <150)
VLDL: 36 mg/dL (ref 0–40)

## 2023-09-10 LAB — ECHOCARDIOGRAM COMPLETE
Area-P 1/2: 3.12 cm2
Height: 69 in
S' Lateral: 2.6 cm
Weight: 3601.43 [oz_av]

## 2023-09-10 LAB — HEMOGLOBIN A1C
Hgb A1c MFr Bld: 5.9 % — ABNORMAL HIGH (ref 4.8–5.6)
Mean Plasma Glucose: 122.63 mg/dL

## 2023-09-10 MED ORDER — ASPIRIN 81 MG PO TBEC: 81.0000 mg | DELAYED_RELEASE_TABLET | Freq: Every day | ORAL | Status: AC

## 2023-09-10 MED ORDER — ATORVASTATIN CALCIUM 20 MG PO TABS: 20.0000 mg | ORAL_TABLET | Freq: Every day | ORAL | 2 refills | Status: AC

## 2023-09-10 MED ORDER — CLOPIDOGREL BISULFATE 75 MG PO TABS: 75.0000 mg | ORAL_TABLET | Freq: Every day | ORAL | 0 refills | Status: AC

## 2023-09-10 NOTE — Hospital Course (Signed)
 Courtney Holmes is an 83 year old female with PMH reported history of CVA, chronic pain, osteoarthritis who presented with left-sided weakness.  There was recent concern outpatient for rotator cuff injury due to left shoulder pain. Shoulder imaging with x-ray showed osteopenia and no acute fracture or dislocation.  Moderate osteoarthritis of AC joint.  Left knee x-ray showed trace joint effusion otherwise underlying moderate tricompartmental osteoarthritis noted.  Further stroke workup was commenced with MRI brain which showed small acute/early subacute infarct involving right basal ganglia. CT angio head/neck negative for LVO.  Mild to moderate stenosis involving P1 segment of the left PCA and moderate stenosis of the P2 segment left PCA. She was evaluated by PT and recommended for outpatient PT.  No OT needs. Neurology recommended 21 days of aspirin  and Plavix  followed by monotherapy aspirin . LDL 87 and she was started on Lipitor prior to discharge.  A1c 5.9%.

## 2023-09-10 NOTE — TOC Transition Note (Signed)
 Transition of Care [facility]) - Discharge Note   Patient Details  Name: Courtney Holmes MRN: [id number] Date of Birth: 1940-03-20  [facility] CM/SW Contact:  Andrez JULIANNA George, RN Phone Number: 09/10/2023, 2:01 PM   Clinical Narrative:     Pt is discharging home with outpatient referral sent to [facility]. Pt will call to schedule the first appointment. Pt will obtain a cane outside the hospital setting.  Pt has transportation home.  Final next level of care: OP Rehab Barriers to Discharge: No Barriers Identified   Patient Goals and CMS Choice     Choice offered to / list presented to : Patient      Discharge Placement                       Discharge Plan and Services Additional resources added to the After Visit Summary for                                       Social Drivers of Health (SDOH) Interventions SDOH Screenings   Food Insecurity: No Food Insecurity (09/10/2023)  Housing: Unknown (09/10/2023)  Transportation Needs: No Transportation Needs (09/10/2023)  Utilities: Not At Risk (09/10/2023)  Social Connections: Unknown (09/10/2023)  Tobacco Use: Unknown (09/09/2023)     Readmission Risk Interventions     No data to display

## 2023-09-10 NOTE — TOC CAGE-AID Note (Signed)
 Transition of Care [facility]) - CAGE-AID Screening   Patient Details  Name: Courtney Holmes MRN: [id number] Date of Birth: 1940-10-19  Transition of Care [facility]) CM/SW Contact:    Joeanne Robicheaux E Nadim Malia, LCSW Phone Number: 09/10/2023, 10:04 AM   Clinical Narrative:    CAGE-AID Screening:    Have You Ever Felt You Ought to Cut Down on Your Drinking or Drug Use?: No Have People Annoyed You By [employer] Your Drinking Or Drug Use?: No Have You Felt Bad Or Guilty About Your Drinking Or Drug Use?: No Have You Ever Had a Drink or Used Drugs First Thing In The Morning to Steady Your Nerves or to Get Rid of a Hangover?: No CAGE-AID Score: 0  Substance Abuse Education Offered: No

## 2023-09-10 NOTE — Plan of Care (Signed)
  Problem: Education: Goal: Knowledge of disease or condition will improve Outcome: Progressing Goal: Knowledge of secondary prevention will improve (MUST DOCUMENT ALL) Outcome: Progressing   Problem: Ischemic Stroke/TIA Tissue Perfusion: Goal: Complications of ischemic stroke/TIA will be minimized 09/10/2023 0703 by Gwynneth Richerd LABOR, RN Outcome: Progressing 09/10/2023 0703 by Gwynneth Richerd LABOR, RN Outcome: Progressing   Problem: Coping: Goal: Will verbalize positive feelings about self 09/10/2023 0703 by Gwynneth Richerd LABOR, RN Outcome: Progressing 09/10/2023 0703 by Gwynneth Richerd LABOR, RN Outcome: Progressing   Problem: Self-Care: Goal: Verbalization of feelings and concerns over difficulty with self-care will improve Outcome: Progressing

## 2023-09-10 NOTE — Evaluation (Addendum)
 Physical Therapy Evaluation Patient Details Name: Courtney Holmes MRN: [id number] DOB: 1940/03/29 Today's Date: 09/10/2023  History of Present Illness  83 y.o. female presents to [facility] 09/09/23 with L sided shoulder and knee pain with decreased sensation. 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 Negative x-ray for acute fx in shoulder/knee. PMHx: CVA, chronic pain syndrome, chronic OA   Clinical Impression  Pt in bed upon arrival with family present and agreeable to PT eval. PTA, pt was independent for mobility with no AD. In today's session, pt was able to stand and ambulate >328ft with supervision/CGA. Practiced stair negotiation with 1HR and supervision for safety. Trialed stair negotiation with SP cane per family request with pt needing moderate cues for technique. Family would prefer to have a SP cane available in case pt starts having increased weakness and difficulty with stairs. Discussed and demonstrated proper guarding with gait belt given at end of session. Throughout the session, pt would move quickly and get distracted by the environment resulting in pt drifting right/left. Pt scored a 17/24 on the DGI indicating pt is at a higher risk of falling. Pt has intermittent level of assist available at home. Recommending post-acute neuro OP PT to address balance deficits and work on independence with mobility. Pt would benefit from acute skilled PT with current functional limitations listed below (see PT Problem List). Acute PT to follow.         If plan is discharge home, recommend the following: A little help with walking and/or transfers;A little help with bathing/dressing/bathroom;Assist for transportation;Help with stairs or ramp for entrance   Can travel by private vehicle    Yes    Equipment Recommendations [place]     Functional Status Assessment Patient has had a recent decline in their functional status and demonstrates the ability to make significant improvements in  function in a reasonable and predictable amount of time.     Precautions / Restrictions Precautions Precautions: Fall Recall of Precautions/Restrictions: Intact Restrictions Weight Bearing Restrictions Per Provider Order: No      Mobility  Bed Mobility Overal bed mobility: Modified Independent   Transfers Overall transfer level: Needs assistance Equipment used: None Transfers: Sit to/from Stand Sit to Stand: Supervision    General transfer comment: supervision for safety    Ambulation/Gait Ambulation/Gait assistance: Supervision, Contact guard assist Gait Distance (Feet): 300 Feet Assistive device: None Gait Pattern/deviations: Step-through pattern, Drifts right/left Gait velocity: decr     General Gait Details: occasionally drifts right/left with pt getting distracted by people and the enviroment. Occasionally unsteady with pt able to correct balance with lateral side-steps and no physical assist needed  Stairs Stairs: Yes Stairs assistance: Supervision Stair Management: One rail Right, Alternating pattern, Forwards, With cane Number of Stairs: 18 (x8 w/ no AD, x10 with SP cane) General stair comments: supervision with 1 HR and cues for technique, family requested to trial using SP cane for stair negotiation with moderate cues necessary to technique  Modified Rankin (Stroke Patients Only) Modified Rankin (Stroke Patients Only) Pre-Morbid Rankin Score: No symptoms Modified Rankin: Moderately severe disability     Balance Overall balance assessment: Needs assistance Sitting-balance support: No upper extremity supported, Feet supported Sitting balance-Leahy Scale: Good     Standing balance support: No upper extremity supported Standing balance-Leahy Scale: Fair    Standardized Balance Assessment Standardized Balance Assessment : Dynamic Gait Index   Dynamic Gait Index Level Surface: Normal Change in Gait Speed: Normal Gait with Horizontal Head Turns: Mild  Impairment Gait with Vertical Head Turns: Mild Impairment Gait and Pivot Turn: Normal Step Over Obstacle: Moderate Impairment Step Around Obstacles: Mild Impairment Steps: Moderate Impairment Total Score: 17       Pertinent Vitals/Pain Pain Assessment Pain Assessment: Faces Faces Pain Scale: Hurts little more Pain Location: L knee cramp and L hand Pain Descriptors / Indicators: Aching Pain Intervention(s): Limited activity within patient's tolerance, Monitored during session, Repositioned    Home Living Family/patient expects to be discharged to:: Private residence Living Arrangements: Children (daughter) Available Help at Discharge: Family;Available PRN/intermittently Type of Home: House Home Access: Stairs to enter Entrance Stairs-Rails: None Entrance Stairs-Number of Steps: 4 Alternate Level Stairs-Number of Steps: ~20 steps Home Layout: Two level;Bed/bath upstairs Home Equipment: Grab bars - tub/shower      Prior Function Prior Level of Function : Independent/Modified Independent;Driving    Mobility Comments: ind no AD ADLs Comments: ind     Extremity/Trunk Assessment   Upper Extremity Assessment Upper Extremity Assessment: Defer to OT evaluation LUE Deficits / Details: Hx of L rotator injury, ROM WFL but strength 3/5 LUE Sensation: decreased light touch LUE Coordination: WNL    Lower Extremity Assessment Lower Extremity Assessment: RLE deficits/detail;LLE deficits/detail RLE Deficits / Details: Hip flexion 4/5, Knee ext 4+/5 RLE Sensation: WNL RLE Coordination: WNL LLE Deficits / Details: Limited due to pain in knee and low back with MMT, At least 4/5 LLE Sensation: decreased light touch LLE Coordination: WNL    Cervical / Trunk Assessment Cervical / Trunk Assessment: Normal  Communication   Communication Communication: No apparent difficulties       Cognition Arousal: Alert Behavior During Therapy: WFL for tasks assessed/performed   PT -  Cognitive impairments: Attention        PT - Cognition Comments: externally distracted requiring cues for re-direction and to adhere to task at hand Following commands: Intact       Cueing Cueing Techniques: Verbal cues     General Comments General comments (skin integrity, edema, etc.): Pt does demonstrate some mild balance deficits.       09/10/23 0956  Other Exercises  Other Exercises x5 TA activation, x5 bridge, x5 STS with B UE support, discussed forward and lateral step ups   PT Assessment Patient needs continued PT services  PT Problem List Decreased strength;Decreased activity tolerance;Decreased balance;Decreased mobility;Decreased knowledge of use of DME       PT Treatment Interventions DME instruction;Gait training;Stair training;Functional mobility training;Therapeutic exercise;Therapeutic activities;Balance training;Neuromuscular re-education;Patient/family education    PT Goals (Current goals can be found in the Care Plan section)  Acute Rehab PT Goals Patient Stated Goal: to feel better PT Goal Formulation: With patient/family Time For Goal Achievement: 09/24/23 Potential to Achieve Goals: Good    Frequency Min 2X/week        AM-PAC PT 6 Clicks Mobility  Outcome Measure Help needed turning from your back to your side while in a flat bed without using bedrails?: None Help needed moving from lying on your back to sitting on the side of a flat bed without using bedrails?: None Help needed moving to and from a bed to a chair (including a wheelchair)?: A Little Help needed standing up from a chair using your arms (e.g., wheelchair or bedside chair)?: A Little Help needed to walk in hospital room?: A Little Help needed climbing 3-5 steps with a railing? : A Little 6 Click Score: 20    End of Session   Activity Tolerance: Patient tolerated treatment well Patient left:  in bed;with call bell/phone within reach;with family/visitor present Nurse  Communication: Mobility status PT Visit Diagnosis: Unsteadiness on feet (R26.81);Other abnormalities of gait and mobility (R26.89);Muscle weakness (generalized) (M62.81)    Time: 9153-9081 PT Time Calculation (min) (ACUTE ONLY): 32 min   Charges:   PT Evaluation $PT Eval Moderate Complexity: 1 Mod PT Treatments $Therapeutic Activity: 8-22 mins PT General Charges $$ ACUTE PT VISIT: 1 Visit       Kate ORN, PT, DPT Secure Chat Preferred  Rehab Office [phone]   Kate BRAVO Wendolyn 09/10/2023, 10:11 AM

## 2023-09-10 NOTE — Care Management Obs Status (Signed)
 MEDICARE OBSERVATION STATUS NOTIFICATION   Patient Details  Name: Courtney Holmes MRN: [id number] Date of Birth: 1940/11/25   Medicare Observation Status Notification Given:  Yes    Andrez JULIANNA George, RN 09/10/2023, 1:54 PM

## 2023-09-10 NOTE — Care Management CC44 (Signed)
 Condition Code 44 Documentation Completed  Patient Details  Name: Courtney Holmes MRN: [id number] Date of Birth: Apr 16, 1940   Condition Code 44 given:  Yes Patient signature on Condition Code 44 notice:  Yes Documentation of 2 MD's agreement:  Yes Code 44 added to claim:  Yes    Andrez JULIANNA George, RN 09/10/2023, 1:54 PM

## 2023-09-10 NOTE — Consult Note (Signed)
 NEUROLOGY CONSULT NOTE   Date of service: September 10, 2023 Patient Name: Courtney Holmes MRN:  [id number] DOB:  31-May-1940 Chief Complaint: L sided weakness Requesting Provider: Briana Elgin LABOR, MD  History of Present Illness  Courtney Holmes is a 83 y.o. female with hx of HTN, prior stroke, who presents with pain in left shoulder ad hip but notable L arm and L leg weakness.  She saw her PCP and sent to the ED for further evaluation and workup.  Reports L shoulder pain over the last 1-2 weeks, over the last 2-3 days, L arm and leg tingling and weakness.  She had MRI Brain w/o contrast which demonstrated a small right basal ganglia stroke.  Neurology consulted for further evaluation and workup.   LKW: 09/07/23 Modified rankin score: 1-No significant post stroke disability and can perform usual duties with stroke symptoms IV Thrombolysis: not offered, too mild to treat and outside the window. EVT: not offered, outside window and too mild to treat and no LVO.  NIHSS components Score: Comment  1a Level of Conscious 0[x]  1[]  2[]  3[]      1b LOC Questions 0[x]  1[]  2[]       1c LOC Commands 0[x]  1[]  2[]       2 Best Gaze 0[x]  1[]  2[]       3 Visual 0[x]  1[]  2[]  3[]      4 Facial Palsy 0[x]  1[]  2[]  3[]      5a Motor Arm - left 0[x]  1[]  2[]  3[]  4[]  UN[]    5b Motor Arm - Right 0[x]  1[]  2[]  3[]  4[]  UN[]    6a Motor Leg - Left 0[x]  1[]  2[]  3[]  4[]  UN[]    6b Motor Leg - Right 0[x]  1[]  2[]  3[]  4[]  UN[]    7 Limb Ataxia 0[x]  1[]  2[]  UN[]      8 Sensory 0[]  1[x]  2[]  UN[]      9 Best Language 0[x]  1[]  2[]  3[]      10 Dysarthria 0[x]  1[]  2[]  UN[]      11 Extinct. and Inattention 0[x]  1[]  2[]       TOTAL:       ROS  Comprehensive ROS performed and pertinent positives documented in HPI   Past History   Past Medical History:  Diagnosis Date   Hypertension     Past Surgical History:  Procedure Laterality Date   TUBAL LIGATION      Family History: History reviewed. No pertinent family  history.  Social History  reports that she has never smoked. She does not have any smokeless tobacco history on file. She reports that she does not drink alcohol and does not use drugs.  No Known Allergies  Medications   Current Facility-Administered Medications:     stroke: early stages of recovery book, , Does not apply, Once, Sundil, Subrina, MD   acetaminophen  (TYLENOL ) tablet 650 mg, 650 mg, Oral, Q4H PRN, 650 mg at 09/10/23 0508 **OR** acetaminophen  (TYLENOL ) 160 MG/5ML solution 650 mg, 650 mg, Per Tube, Q4H PRN **OR** acetaminophen  (TYLENOL ) suppository 650 mg, 650 mg, Rectal, Q4H PRN, Sundil, Subrina, MD   aspirin  EC tablet 81 mg, 81 mg, Oral, Daily, Sundil, Subrina, MD   atorvastatin  (LIPITOR) tablet 20 mg, 20 mg, Oral, Daily, Sundil, Subrina, MD, 20 mg at 09/09/23 2240   clopidogrel  (PLAVIX ) tablet 75 mg, 75 mg, Oral, Daily, Sundil, Subrina, MD   enoxaparin  (LOVENOX ) injection 40 mg, 40 mg, Subcutaneous, Q24H, Sundil, Subrina, MD, 40 mg at 09/09/23 2241   hydrALAZINE  (APRESOLINE ) injection 10 mg, 10 mg, Intravenous, Q6H PRN, Sundil, Subrina,  MD, 10 mg at 09/10/23 0504   senna-docusate (Senokot-S) tablet 1 tablet, 1 tablet, Oral, QHS PRN, Sundil, Subrina, MD   sodium chloride  flush (NS) 0.9 % injection 3-10 mL, 3-10 mL, Intravenous, Q12H, Sundil, Subrina, MD, 10 mL at 09/14/2023 2253   sodium chloride  flush (NS) 0.9 % injection 3-10 mL, 3-10 mL, Intravenous, PRN, Sundil, Subrina, MD  Vitals   Vitals:   09/14/23 2343 09/10/23 0200 09/10/23 0330 09/10/23 0450  BP: (!) 189/90 (!) 183/79 (!) 171/76 (!) 188/176  Pulse: 71 63 63 64  Resp: 18 18 18 18   Temp: 98.6 F (37 C)  98.3 F (36.8 C) 98.5 F (36.9 C)  TempSrc:    Oral  SpO2: 96% 94% 97% 99%  Weight:      Height:        Body mass index is 33.24 kg/m.   Physical Exam    General: Laying comfortably in bed; in no acute distress.  HENT: Normal oropharynx and mucosa. Normal external appearance of ears and nose.  Neck:  Supple, no pain or tenderness  CV: No JVD. No peripheral edema.  Pulmonary: Symmetric Chest rise. Normal respiratory effort.  Abdomen: Soft to touch, non-tender.  Ext: No cyanosis, edema, or deformity  Skin: No rash. Normal palpation of skin.   Musculoskeletal: Normal digits and nails by inspection. No clubbing.   Neurologic Examination  Mental status/Cognition: Alert, oriented to self, place, month and year, good attention.  Speech/language: Fluent, comprehension intact, object naming intact, repetition intact.  Cranial nerves:   CN II Pupils equal and reactive to light, no VF deficits    CN III,IV,VI EOM intact, no gaze preference or deviation, no nystagmus    CN V normal sensation in V1, V2, and V3 segments bilaterally    CN VII no asymmetry, no nasolabial fold flattening    CN VIII normal hearing to speech    CN IX & X normal palatal elevation, no uvular deviation    CN XI 5/5 head turn and 5/5 shoulder shrug bilaterally    CN XII midline tongue protrusion    Motor:  Muscle bulk: normal, tone normal, pronator drift none tremor none Mvmt Root Nerve  Muscle Right Left Comments  SA C5/6 Ax Deltoid 5 5   EF C5/6 Mc Biceps 5 5   EE C6/7/8 Rad Triceps 5 5   WF C6/7 Med FCR     WE C7/8 PIN ECU     F Ab C8/T1 U ADM/FDI 5 5   HF L1/2/3 Fem Illopsoas 5 5   KE L2/3/4 Fem Quad 5 5   DF L4/5 D Peron Tib Ant 5 5   PF S1/2 Tibial Grc/Sol 5 5    Sensation:  Light touch Decreased in L lower face, L arm and L leg.   Pin prick    Temperature    Vibration   Proprioception    Coordination/Complex Motor:  - Finger to Nose intact BL - Heel to shin intact BL - Rapid alternating movement are normal - Gait: deferred.  Labs/Imaging/Neurodiagnostic studies   CBC:  Recent Labs  Lab 2023/09/14 1734  WBC 5.9  NEUTROABS 2.7  HGB 12.2  HCT 39.0  MCV 87.2  PLT 223   Basic Metabolic Panel:  Lab Results  Component Value Date   NA 142 14-Sep-2023   K 4.0 09/14/2023   CO2 24 09/14/2023    GLUCOSE 104 (H) 09-14-23   BUN 20 Sep 14, 2023   CREATININE 0.91 2023-09-14   CALCIUM  9.3 2023/09/14  GFRNONAA >60 09/09/2023   GFRAA >60 09/14/2015   Lipid Panel: No results found for: LDLCALC HgbA1c: No results found for: HGBA1C Urine Drug Screen: No results found for: LABOPIA, COCAINSCRNUR, LABBENZ, AMPHETMU, THCU, LABBARB  Alcohol Level     Component Value Date/Time   [facility] <15 09/09/2023 1734   INR  Lab Results  Component Value Date   INR 1.0 09/09/2023   APTT  Lab Results  Component Value Date   APTT 32 09/09/2023   AED levels: No results found for: PHENYTOIN, ZONISAMIDE, LAMOTRIGINE, LEVETIRACETA  CT Head without contrast(Personally reviewed): CTH was negative for a large hypodensity concerning for a large territory infarct or hyperdensity concerning for an ICH  CT angio Head and Neck with contrast(Personally reviewed): No LVO  MRI Brain(Personally reviewed): R BG stroke.  ASSESSMENT   Courtney Holmes is a 83 y.o. female  with hx of HTN, prior stroke, who presents with pain in left shoulder x 1-2 weeks and L face,L arm and L leg numbness x 2-3 days. MRI shows a small R BG stroke.  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  RECOMMENDATIONS  - Frequent Neuro checks per stroke unit protocol - Recommend obtaining TTE  - Recommend obtaining Lipid panel with LDL - Please start statin if LDL > 70 - Recommend HbA1c to evaluate for diabetes and how well it is controlled. - Antithrombotic - aspirin  81mg  daily along with plavix  75mg  daily x 21 days, followed by Aspirin  81mg  daily alone. - Recommend DVT ppx - SBP goal - aim for gradual normotension. - Recommend Telemetry monitoring for arrythmia - Recommend bedside swallow screen prior to PO intake. - Stroke education booklet - Recommend PT/OT/SLP consult  ______________________________________________________________________  Plan discussed with patient and her daughter at  the bedside. All questions were answered.  Signed, Cynia Abruzzo, MD [facility]

## 2023-09-10 NOTE — ED Notes (Addendum)
 Called [facility] to inquire about bed continuing to be marked dirty almost 4 hours after bed assignment.  Staff informed me that the bed is still dirty.  AC notified.

## 2023-09-10 NOTE — Plan of Care (Signed)
  Problem: Education: Goal: Knowledge of secondary prevention will improve (MUST DOCUMENT ALL) Outcome: Progressing   Problem: Ischemic Stroke/TIA Tissue Perfusion: Goal: Complications of ischemic stroke/TIA will be minimized Outcome: Progressing   Problem: Coping: Goal: Will verbalize positive feelings about self Outcome: Progressing   Problem: Self-Care: Goal: Verbalization of feelings and concerns over difficulty with self-care will improve Outcome: Progressing

## 2023-09-10 NOTE — Progress Notes (Addendum)
 STROKE TEAM PROGRESS NOTE    INTERIM HISTORY/SUBJECTIVE Patient presented with left-sided weakness for the last 3 days.  Denies any prior history of strokes.  MRI scan shows a right basal ganglia subacute infarct.  CT angiogram shows mild left P1 and P2 stenosis. Patient has remained hemodynamically stable and afebrile overnight.  She continues to have slight left-sided weakness and diminished sensation on the left.  OBJECTIVE  CBC    Component Value Date/Time   WBC 5.6 09/10/2023 0746   RBC 4.39 09/10/2023 0746   HGB 12.1 09/10/2023 0746   HCT 37.6 09/10/2023 0746   PLT 220 09/10/2023 0746   MCV 85.6 09/10/2023 0746   MCH 27.6 09/10/2023 0746   MCHC 32.2 09/10/2023 0746   RDW 14.0 09/10/2023 0746   LYMPHSABS 2.4 09/09/2023 1734   MONOABS 0.5 09/09/2023 1734   EOSABS 0.2 09/09/2023 1734   BASOSABS 0.0 09/09/2023 1734    BMET    Component Value Date/Time   NA 139 09/10/2023 0746   K 4.2 09/10/2023 0746   CL 105 09/10/2023 0746   CO2 25 09/10/2023 0746   GLUCOSE 138 (H) 09/10/2023 0746   BUN 15 09/10/2023 0746   CREATININE 1.04 (H) 09/10/2023 0746   CALCIUM  8.8 (L) 09/10/2023 0746   GFRNONAA 53 (L) 09/10/2023 0746    IMAGING past 24 hours ECHOCARDIOGRAM COMPLETE Result Date: 09/10/2023    ECHOCARDIOGRAM REPORT   Patient Name:   Courtney Holmes Date of Exam: 09/10/2023 Medical Rec #:  [id number]      Height:       69.0 in Accession #:    [id number]     Weight:       225.1 lb Date of Birth:  Jan 14, 1941      BSA:          2.172 m Patient Age:    83 years       BP:           166/74 mmHg Patient Gender: F              HR:           65 bpm. Exam Location:  Inpatient Procedure: 2D Echo (Both Spectral and Color Flow Doppler were utilized during            procedure). Indications:    stroke  History:        Patient has no prior history of Echocardiogram examinations.  Sonographer:    Tinnie Barefoot RDCS Referring Phys: [id number] SUBRINA SUNDIL IMPRESSIONS  1. Left ventricular ejection  fraction, by estimation, is 60 to 65%. The left ventricle has normal function. The left ventricle has no regional wall motion abnormalities. Left ventricular diastolic parameters are indeterminate. Elevated left atrial pressure. The E/e' is 22.  2. Right ventricular systolic function is normal. The right ventricular size is normal. There is normal pulmonary artery systolic pressure. The estimated right ventricular systolic pressure is 15.1 mmHg.  3. The mitral valve is degenerative. No evidence of mitral valve regurgitation. No evidence of mitral stenosis.  4. The aortic valve is tricuspid. Aortic valve regurgitation is not visualized. Aortic valve sclerosis is present, with no evidence of aortic valve stenosis.  5. The inferior vena cava is normal in size with greater than 50% respiratory variability, suggesting right atrial pressure of 3 mmHg. Comparison(s): No prior Echocardiogram. Conclusion(s)/Recommendation(s): No intracardiac source of embolism detected on this transthoracic study. Consider a transesophageal echocardiogram to exclude cardiac source of embolism if clinically indicated. FINDINGS  Left Ventricle: Left ventricular ejection fraction, by estimation, is 60 to 65%. The left ventricle has normal function. The left ventricle has no regional wall motion abnormalities. The left ventricular internal cavity size was normal in size. There is  borderline left ventricular hypertrophy. Left ventricular diastolic parameters are indeterminate. Elevated left atrial pressure. The E/e' is 22. Right Ventricle: The right ventricular size is normal. No increase in right ventricular wall thickness. Right ventricular systolic function is normal. There is normal pulmonary artery systolic pressure. The tricuspid regurgitant velocity is 1.74 m/s, and  with an assumed right atrial pressure of 3 mmHg, the estimated right ventricular systolic pressure is 15.1 mmHg. Left Atrium: Left atrial size was normal in size. Right  Atrium: Right atrial size was normal in size. Pericardium: Trivial pericardial effusion is present. Mitral Valve: The mitral valve is degenerative in appearance. Mild mitral annular calcification. No evidence of mitral valve regurgitation. No evidence of mitral valve stenosis. Tricuspid Valve: The tricuspid valve is normal in structure. Tricuspid valve regurgitation is trivial. No evidence of tricuspid stenosis. Aortic Valve: The aortic valve is tricuspid. Aortic valve regurgitation is not visualized. Aortic valve sclerosis is present, with no evidence of aortic valve stenosis. Pulmonic Valve: The pulmonic valve was grossly normal. Pulmonic valve regurgitation is not visualized. No evidence of pulmonic stenosis. Aorta: The aortic root and ascending aorta are structurally normal, with no evidence of dilitation. Venous: The inferior vena cava is normal in size with greater than 50% respiratory variability, suggesting right atrial pressure of 3 mmHg. IAS/Shunts: The atrial septum is grossly normal.  LEFT VENTRICLE PLAX 2D LVIDd:         4.80 cm   Diastology LVIDs:         2.60 cm   LV e' medial:    4.03 cm/s LV PW:         1.10 cm   LV E/e' medial:  24.7 LV IVS:        1.40 cm   LV e' lateral:   5.33 cm/s LVOT diam:     2.00 cm   LV E/e' lateral: 18.6 LV SV:         83 LV SV Index:   38 LVOT Area:     3.14 cm  RIGHT VENTRICLE             IVC RV Basal diam:  3.20 cm     IVC diam: 1.20 cm RV S prime:     19.90 cm/s TAPSE (M-mode): 1.6 cm LEFT ATRIUM             Index        RIGHT ATRIUM           Index LA diam:        4.20 cm 1.93 cm/m   RA Area:     14.80 cm LA Vol (A2C):   60.6 ml 27.90 ml/m  RA Volume:   34.10 ml  15.70 ml/m LA Vol (A4C):   66.6 ml 30.66 ml/m LA Biplane Vol: 66.5 ml 30.61 ml/m  AORTIC VALVE LVOT Vmax:   111.00 cm/s LVOT Vmean:  74.100 cm/s LVOT VTI:    0.264 m  AORTA Ao Root diam: 2.90 cm Ao Asc diam:  3.10 cm MITRAL VALVE                TRICUSPID VALVE MV Area (PHT): 3.12 cm     TR Peak grad:    12.1 mmHg MV Decel Time: 243 msec  TR Vmax:        174.00 cm/s MV E velocity: 99.40 cm/s MV A velocity: 134.00 cm/s  SHUNTS MV E/A ratio:  0.74         Systemic VTI:  0.26 m                             Systemic Diam: 2.00 cm Sunit Tolia Electronically signed by Madonna Large Signature Date/Time: 09/10/2023/11:35:53 AM    Final    DG Knee 1-2 Views Left Result Date: 09/09/2023 CLINICAL DATA:  Knot on the knee, uncontrollable twitching and moving EXAM: LEFT KNEE - 1-2 VIEW COMPARISON:  None Available. FINDINGS: Osteopenia.No acute fracture or dislocation. Trace joint effusion. Moderate tricompartmental joint space loss with osteophyte formation. Quadriceps insertion enthesophyte. Soft tissues are unremarkable. IMPRESSION: 1. Trace joint effusion.  No acute fracture or dislocation. 2. Moderate tricompartmental osteoarthritis of the knee. Electronically Signed   By: Rogelia Myers M.D.   On: 09/09/2023 21:26   DG Shoulder Left Result Date: 09/09/2023 CLINICAL DATA:  [id number] Shoulder pain [id number] EXAM: LEFT SHOULDER - 2+ VIEW COMPARISON:  October 26, 2014 FINDINGS: Osteopenia.No acute fracture or dislocation. Moderate AC joint space loss, unchanged. Apparent cardiomegaly. IMPRESSION: 1. Osteopenia.  No acute fracture or dislocation. 2. Moderate osteoarthritis of the AC joint. Electronically Signed   By: Rogelia Myers M.D.   On: 09/09/2023 21:24   CT ANGIO HEAD NECK W WO CM Result Date: 09/09/2023 CLINICAL DATA:  Transient ischemic attack, decreased sensation. Acute versus early subacute infarct in the right basal ganglia. EXAM: CT ANGIOGRAPHY HEAD AND NECK WITH AND WITHOUT CONTRAST TECHNIQUE: Multidetector CT imaging of the head and neck was performed using the standard protocol during bolus administration of intravenous contrast. Multiplanar CT image reconstructions and MIPs were obtained to evaluate the vascular anatomy. Carotid stenosis measurements (when applicable) are obtained utilizing NASCET criteria,  using the distal internal carotid diameter as the denominator. RADIATION DOSE REDUCTION: This exam was performed according to the departmental dose-optimization program which includes automated exposure control, adjustment of the mA and/or kV according to patient size and/or use of iterative reconstruction technique. CONTRAST:  75mL OMNIPAQUE  IOHEXOL  350 MG/ML SOLN COMPARISON:  MRI head 09/09/2023. FINDINGS: CT HEAD FINDINGS Brain: No acute intracranial hemorrhage. No CT evidence of acute infarct. No edema, mass effect, or midline shift. The basilar cisterns are patent. Ventricles: The ventricles are normal. Vascular: No hyperdense vessel or unexpected calcification. Skull: No acute or aggressive finding. Orbits: Bilateral lens replacement. Sinuses: Mild mucosal thickening in the ethmoid sinuses. Other: Mastoid air cells are clear. CTA NECK FINDINGS Aortic arch: Common origin of the brachiocephalic and left common carotid arteries. Imaged portion shows no evidence of aneurysm or dissection. Mild atherosclerosis of the visualized aortic arch. No significant stenosis of the major arch vessel origins. Pulmonary arteries: As permitted by contrast timing, there are no filling defects in the visualized pulmonary arteries. Subclavian arteries: The subclavian arteries are patent bilaterally. Right carotid system: No evidence of dissection, stenosis (50% or greater), or occlusion. Tortuosity of the proximal common carotid artery. Mild atherosclerosis along the proximal cervical ICA without hemodynamically significant stenosis. Mild tortuosity of the cervical ICA. Left carotid system: No evidence of dissection, stenosis (50% or greater), or occlusion. Calcified and noncalcified atherosclerotic plaque at the carotid bifurcation without hemodynamically significant stenosis. Vertebral arteries: Codominant. No evidence of dissection, stenosis (50% or greater), or occlusion. Skeleton: No acute findings. Degenerative changes in the  cervical spine. Edentulous maxilla with associated chronic bone loss. Other neck: The visualized airway is patent. No cervical lymphadenopathy. Upper chest: Visualized lung apices are clear. Review of the MIP images confirms the above findings CTA HEAD FINDINGS ANTERIOR CIRCULATION: The intracranial internal carotid arteries are patent bilaterally. Minimal atherosclerosis of the carotid siphons. No significant stenosis. MCAs: The middle cerebral arteries are patent bilaterally. ACAs: The anterior cerebral arteries are patent bilaterally. POSTERIOR CIRCULATION: No significant stenosis, proximal occlusion, aneurysm, or vascular malformation. PCAs: Patent bilaterally. Long segment mild-to-moderate stenosis of the left P1 segment. Additional irregularity and moderate stenosis of the left P2 segment. Pcomm: Not well visualized. SCAs: The superior cerebellar arteries are patent bilaterally. Basilar artery: Patent AICAs: Visualized on the right. PICAs: Visualized on the left. Vertebral arteries: The intracranial vertebral arteries are patent. Venous sinuses: As permitted by contrast timing, patent. Anatomic variants: None Review of the MIP images confirms the above findings IMPRESSION: No large vessel occlusion. 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 No CT evidence of acute intracranial abnormality. Infarct in the right basal ganglia noted on MRI is not well visualized on the current study. Electronically Signed   By: Donnice Mania M.D.   On: 09/09/2023 20:44   MR BRAIN WO CONTRAST Result Date: 09/09/2023 CLINICAL DATA:  Transient ischemic attack (TIA) Left arm / leg weakness - intermittent x 4 days EXAM: MRI HEAD WITHOUT CONTRAST TECHNIQUE: Multiplanar, multiecho pulse sequences of the brain and surrounding structures were obtained without intravenous contrast. COMPARISON:  CT head 02/28/2023. FINDINGS: Brain: Small acute or  early subacute infarct in the right basal ganglia (series 5, image 31). Mild associated edema without mass effect. No midline shift, acute hemorrhage, mass lesion or hydrocephalus. Vascular: Major arterial flow voids are maintained at the skull base. Skull and upper cervical spine: Normal marrow signal. Sinuses/Orbits: Mild paranasal sinus mucosal thickening. No acute orbital findings. 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 Electronically Signed   By: Gilmore GORMAN Molt M.D.   On: 09/09/2023 20:01    Vitals:   09/10/23 0330 09/10/23 0450 09/10/23 0736 09/10/23 1143  BP: (!) 171/76 (!) 188/176 (!) 166/74 (!) 174/91  Pulse: 63 64 65 (!) 59  Resp: 18 18 16 16   Temp: 98.3 F (36.8 C) 98.5 F (36.9 C) 98.2 F (36.8 C) 98 F (36.7 C)  TempSrc:  Oral Oral Oral  SpO2: 97% 99% 99% 98%  Weight:      Height:         PHYSICAL EXAM General:  Alert, well-nourished, well-developed patient in no acute distress Psych:  Mood and affect appropriate for situation CV: Regular rate and rhythm on monitor Respiratory:  Regular, unlabored respirations on room air  NEURO:  Mental Status: AA&Ox3, patient is able to give clear and coherent history Speech/Language: speech is without dysarthria or aphasia.    Cranial Nerves:  II: PERRL. Visual fields full.  III, IV, VI: EOMI. Eyelids elevate symmetrically.  V: Sensation is intact to light touch and diminished on the left VII: Face is symmetrical resting and smiling VIII: hearing intact to voice. IX, X: Phonation is normal.  KP:Dynloizm shrug 5/5. XII: tongue is midline without fasciculations. Motor: Able to move all 4 extremities with good antigravity strength, but diminished fine finger movements and grip strength on the left Tone: is normal and bulk is normal Sensation- Intact to light touch bilaterally but diminished  on the left Coordination: FTN intact bilaterally,  Gait- deferred  Most Recent NIH  1a Level  of Conscious.: 0 1b LOC Questions: 0 1c LOC Commands: 0 2 Best Gaze: 0 3 Visual: 0 4 Facial Palsy: 0 5a Motor Arm - left: 0 5b Motor Arm - Right: 0 6a Motor Leg - Left: 0 6b Motor Leg - Right: 0 7 Limb Ataxia: 0 8 Sensory: 1 9 Best Language: 0 10 Dysarthria: 0 11 Extinct. and Inatten.: 0 TOTAL: 1   ASSESSMENT/PLAN  Ms. Courtney Holmes is a 83 y.o. female with history of stroke and hypertension admitted for pain in the left shoulder and hip along with left arm and leg weakness.  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  CTA head & neck no LVO, mild to moderate long segment stenosis of P1 segment of left PCA and additional moderate stenosis of P2 segment of left PCA MRI acute early subacute infarct in right basal ganglia 2D Echo EF 60 to 65%, normal left atrial size, normal atrial septum LDL 87 HgbA1c 5.9 VTE prophylaxis -Lovenox  No antithrombotic prior to admission, now on aspirin  81 mg daily and clopidogrel  75 mg daily for 3 weeks and then aspirin  alone. Therapy recommendations:  Outpatient PT/OT/ST Disposition: Pending, likely home  Hx of Stroke/TIA Patient has reported history of stroke, but no records of this can be found in epic  Hypertension Home meds: None Stable Blood Pressure Goal: BP less than 220/110   Hyperlipidemia Home meds: None LDL 87, goal < 70 Add atorvastatin  20 mg daily High intensity statin not indicated as LDL near goal Continue statin at discharge  Other Stroke Risk Factors Obesity, Body mass index is 33.24 kg/m., BMI >/= 30 associated with increased stroke risk, recommend weight loss, diet and exercise as appropriate    Other Active Problems None  Hospital day # 1  Patient seen by NP with MD, MD to admit note as needed. Cortney E Everitt Clint Kill , MSN, AGACNP-BC [facility] See Amion for schedule and pager information 09/10/2023 12:26 PM  I have personally obtained history,examined this patient, reviewed notes, independently viewed imaging studies, participated in medical decision making and plan of care.ROS completed by me personally and pertinent positives fully documented  I have made any additions or clarifications directly to the above note. Agree with note above.  Patient presented with 3-day history of left-sided weakness due to small right basal ganglia lacunar infarct likely from small vessel disease.  Recommend aspirin  and Plavix  for 3 weeks followed by aspirin  alone and aggressive risk factor modification.  Mobilize out of bed.  Therapy consults.  No ongoing stroke workup.  Long discussion with patient and daughter answered questions.   I personally spent a total of 50 minutes in the care of the patient today including getting/reviewing separately obtained history, performing a medically appropriate exam/evaluation, counseling and educating, placing orders, referring and communicating with other health care professionals, documenting clinical information in the EHR, independently interpreting results, and coordinating care.    Eather Popp, MD Medical Director [facility] Pager: [phone] 09/10/2023 12:45 PM   To contact Stroke Continuity provider, please refer to [email or web address]. After hours, contact General Neurology

## 2023-09-10 NOTE — Progress Notes (Signed)
  Echocardiogram 2D Echocardiogram has been performed.  Courtney Holmes 09/10/2023, 11:24 AM

## 2023-09-10 NOTE — Evaluation (Addendum)
 Occupational Therapy Evaluation Patient Details Name: Courtney Holmes MRN: [id number] DOB: [birth year] Today's Date: 09/10/2023   History of Present Illness   83 y.o. female presents to [facility] 09/09/23 with L sided shoulder and knee pain with decreased sensation. 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 Negative x-ray for acute fx in shoulder/knee. PMHx: CVA, chronic pain syndrome, chronic OA     Clinical Impressions Pt admitted for above, PTA pt lived with daughter and reports being ind with her ADLs/iADLs. Pt currently presenting with some higher level balance deficits and impaired sensation of the LUE. Pt LUE generally weaker at baseline from hx of rotator injury but there was a noteable decrease in grip strength.Overall pt BUE coordination intact. She is completing ADLs with CGA to setup A and ambulating with CGA no AD. Educated pt and family on [employer] CVA education. OT to continue following pt while in acute setting to progress balance during higher level ADL/iADL tasks. No post acute OT needed.      If plan is discharge home, recommend the following:   Other (comment) (prn)     Functional Status Assessment   Patient has had a recent decline in their functional status and demonstrates the ability to make significant improvements in function in a reasonable and predictable amount of time.     Equipment Recommendations   Other (comment);Tub/shower seat [facility])     Recommendations for Other Services         Precautions/Restrictions   Precautions Precautions: Fall (mod fall risk) Recall of Precautions/Restrictions: Intact Restrictions Weight Bearing Restrictions Per Provider Order: No     Mobility Bed Mobility Overal bed mobility: Modified Independent                  Transfers Overall transfer level: Needs assistance Equipment used: None Transfers: Sit to/from Stand Sit to Stand: Supervision                  Balance Overall  balance assessment: Mild deficits observed, not formally tested                                         ADL either performed or assessed with clinical judgement   ADL Overall ADL's : Needs assistance/impaired Eating/Feeding: Independent;Sitting   Grooming: Oral care;Standing;Supervision/safety   Upper Body Bathing: Supervision/ safety;Standing Upper Body Bathing Details (indicate cue type and reason): simulated Lower Body Bathing: Supervison/ safety;Sitting/lateral leans Lower Body Bathing Details (indicate cue type and reason): normally soaks in her tub Upper Body Dressing : Sitting;Set up   Lower Body Dressing: Sitting/lateral leans;Sit to/from stand;Supervision/safety   Toilet Transfer: Supervision/safety;Ambulation   Toileting- Clothing Manipulation and Hygiene: Sit to/from stand;Supervision/safety   Tub/ Shower Transfer: Tub transfer;Contact guard Ship broker Details (indicate cue type and reason): CGA for safety Functional mobility during ADLs: Contact guard assist General ADL Comments: no AD     Vision Baseline Vision/History: 0 No visual deficits Ability to See in Adequate Light: 0 Adequate Patient Visual Report: No change from baseline Vision Assessment?: Yes Eye Alignment: Within Functional Limits Ocular Range of Motion: Within Functional Limits Alignment/Gaze Preference: Within Defined Limits Tracking/Visual Pursuits: Able to track stimulus in all quads without difficulty Convergence: Within functional limits Visual Fields: No apparent deficits Depth Perception:  [facility])     Perception Perception: Within Functional Limits       Praxis Praxis: [facility]  Pertinent Vitals/Pain Pain Assessment Pain Assessment: Faces Faces Pain Scale: Hurts little more Pain Location: L knee cramp and L hand Pain Descriptors / Indicators: Aching Pain Intervention(s): Monitored during session     Extremity/Trunk Assessment Upper Extremity  Assessment Upper Extremity Assessment: Overall WFL for tasks assessed;Right hand dominant;LUE deficits/detail LUE Deficits / Details: Hx of L rotator injury, ROM WFL but strength 3/5 LUE Sensation: decreased light touch LUE Coordination: WNL   Lower Extremity Assessment Lower Extremity Assessment: Defer to PT evaluation   Cervical / Trunk Assessment Cervical / Trunk Assessment: Normal   Communication Communication Communication: No apparent difficulties   Cognition Arousal: Alert Behavior During Therapy: WFL for tasks assessed/performed Cognition: No apparent impairments, No family/caregiver present to determine baseline             OT - Cognition Comments: pt very jocile in nature at baseline.                 Following commands: Intact       Cueing  General Comments   Cueing Techniques: Verbal cues  Pt does demonstrate some mild balance deficits.   Exercises     Shoulder Instructions      Home Living Family/patient expects to be discharged to:: Private residence Living Arrangements: Children (daughter) Available Help at Discharge: Family;Available PRN/intermittently Type of Home: House Home Access: Stairs to enter [employer] of Steps: 4 Entrance Stairs-Rails: None Home Layout: Two level;Bed/bath upstairs Alternate Level Stairs-Number of Steps: ~20 stairs Alternate Level Stairs-Rails: Can reach both;Left;Right Bathroom Shower/Tub: Chief Strategy Officer: Standard     Home Equipment: Grab bars - tub/shower          Prior Functioning/Environment Prior Level of Function : Independent/Modified Independent;Driving             Mobility Comments: ind no AD ADLs Comments: ind    OT Problem List: Impaired balance (sitting and/or standing)   OT Treatment/Interventions: Self-care/ADL training;Therapeutic exercise;Therapeutic activities;Patient/family education      OT Goals(Current goals can be found in the care plan  section)   Acute Rehab OT Goals Patient Stated Goal: to go home OT Goal Formulation: With patient/family Time For Goal Achievement: 09/24/23 Potential to Achieve Goals: Good ADL Goals Pt Will Perform Grooming: with modified independence;standing Pt Will Perform Lower Body Bathing: with modified independence;sit to/from stand Pt Will Perform Lower Body Dressing: with modified independence;sit to/from stand Pt Will Perform Tub/Shower Transfer: with modified independence;Tub transfer   OT Frequency:  Min 1X/week    Co-evaluation              AM-PAC OT 6 Clicks Daily Activity     Outcome Measure Help from another person eating meals?: None Help from another person taking care of personal grooming?: A Little Help from another person toileting, which includes using toliet, bedpan, or urinal?: A Little Help from another person bathing (including washing, rinsing, drying)?: A Little Help from another person to put on and taking off regular upper body clothing?: A Little Help from another person to put on and taking off regular lower body clothing?: A Little 6 Click Score: 19   End of Session Nurse Communication: Mobility status  Activity Tolerance: Patient tolerated treatment well Patient left: Other (comment) (Pt transitioned to care of providing PT.)  OT Visit Diagnosis: Unsteadiness on feet (R26.81)                Time: 9165-9151 OT Time Calculation (min): 14 min Charges:  OT General  Charges $OT Visit: 1 Visit OT Evaluation $OT Eval Low Complexity: 1 Low  09/10/2023  AB, OTR/L  Acute Rehabilitation Services  Office: [phone]   Curtistine JONETTA Das 09/10/2023, 9:49 AM

## 2023-09-10 NOTE — Discharge Summary (Signed)
 Physician Discharge Summary   Courtney Holmes FMW:[id number] DOB: 1940-09-04 DOA: 09/09/2023  PCP: Joshua Francisco, MD  Admit date: 09/09/2023 Discharge date: 09/10/2023   Admitted From: Home Disposition:  Home Discharging physician: Alm Apo, MD Barriers to discharge: none  Recommendations at discharge: Continue outpatient PT   Discharge Condition: stable CODE STATUS: Full  Diet recommendation:  Diet Orders (From admission, onward)     Start     Ordered   09/10/23 0510  Diet Heart Room service appropriate? Yes; Fluid consistency: Thin  Diet effective now       Question Answer Comment  Room service appropriate? Yes   Fluid consistency: Thin      09/10/23 0509   09/10/23 0000  Diet - low sodium heart healthy        09/10/23 1253            Hospital Course: Courtney Holmes is an 83 year old female with PMH reported history of CVA, chronic pain, osteoarthritis who presented with left-sided weakness.  There was recent concern outpatient for rotator cuff injury due to left shoulder pain. Shoulder imaging with x-ray showed osteopenia and no acute fracture or dislocation.  Moderate osteoarthritis of AC joint.  Left knee x-ray showed trace joint effusion otherwise underlying moderate tricompartmental osteoarthritis noted.  Further stroke workup was commenced with MRI brain which showed small acute/early subacute infarct involving right basal ganglia. CT angio head/neck negative for LVO.  Mild to moderate stenosis involving P1 segment of the left PCA and moderate stenosis of the P2 segment left PCA. She was evaluated by PT and recommended for outpatient PT.  No OT needs. Neurology recommended 21 days of aspirin  and Plavix  followed by monotherapy aspirin . LDL 87 and she was started on Lipitor prior to discharge.  A1c 5.9%.  The patient's acute and chronic medical conditions were treated accordingly. On day of discharge, patient was felt deemed stable for discharge. Patient/family  member advised to call PCP or come back to ER if needed.   Principal Diagnosis: Acute CVA (cerebrovascular accident) [facility])  Discharge Diagnoses: Active Hospital Problems   Diagnosis Date Noted   Acute CVA (cerebrovascular accident) (HCC) 09/09/2023   Left shoulder pain 09/09/2023   Knee joint pain 09/09/2023   Chronic pain syndrome 09/09/2023    Resolved Hospital Problems  No resolved problems to display.     Discharge Instructions     Ambulatory referral to Physical Therapy   Complete by: As directed    Diet - low sodium heart healthy   Complete by: As directed    Increase activity slowly   Complete by: As directed       Allergies as of 09/10/2023   No Known Allergies      Medication List     STOP taking these medications    ciprofloxacin  500 MG tablet Commonly known as: CIPRO    HYDROcodone -acetaminophen  5-325 MG tablet Commonly known as: NORCO/VICODIN   metroNIDAZOLE  500 MG tablet Commonly known as: FLAGYL        TAKE these medications    acetaminophen  500 MG tablet Commonly known as: TYLENOL  Take 500 mg by mouth 2 (two) times daily as needed for mild pain (pain score 1-3), moderate pain (pain score 4-6) or headache.   aspirin  EC 81 MG tablet Take 1 tablet (81 mg total) by mouth daily. Swallow whole. Start taking on: September 11, 2023   atorvastatin  20 MG tablet Commonly known as: LIPITOR Take 1 tablet (20 mg total) by mouth daily. Start taking on: September 11, 2023   clopidogrel  75 MG tablet Commonly known as: PLAVIX  Take 1 tablet (75 mg total) by mouth daily for 19 days. Start taking on: September 11, 2023        No Known Allergies  Consultations: Neurology  Procedures:   Discharge Exam: BP (!) 174/91 (BP Location: Left Arm)   Pulse (!) 59   Temp 98 F (36.7 C) (Oral)   Resp 16   Ht 5' 9 (1.753 m)   Wt 102.1 kg   SpO2 98%   BMI 33.24 kg/m  Physical Exam Constitutional:      Appearance: Normal appearance.  HENT:     Head: Normocephalic  and atraumatic.     Mouth/Throat:     Mouth: Mucous membranes are moist.  Eyes:     Extraocular Movements: Extraocular movements intact.  Cardiovascular:     Rate and Rhythm: Normal rate and regular rhythm.  Pulmonary:     Effort: Pulmonary effort is normal. No respiratory distress.     Breath sounds: Normal breath sounds. No wheezing.  Abdominal:     General: Bowel sounds are normal. There is no distension.     Palpations: Abdomen is soft.     Tenderness: There is no abdominal tenderness.  Musculoskeletal:        General: Normal range of motion.     Cervical back: Normal range of motion and neck supple.  Skin:    General: Skin is warm and dry.  Neurological:     Mental Status: She is alert.     Sensory: Sensory deficit (left facial (V1-V3) and LUE/LLE) present.  Psychiatric:        Mood and Affect: Mood normal.      The results of significant diagnostics from this hospitalization (including imaging, microbiology, ancillary and laboratory) are listed below for reference.   Microbiology: No results found for this or any previous visit (from the past 240 hours).   Labs: BNP (last 3 results) No results for input(s): BNP in the last 8760 hours. Basic Metabolic Panel: Recent Labs  Lab 09/09/23 1734 09/10/23 0746  NA 142 139  K 4.0 4.2  CL 108 105  CO2 24 25  GLUCOSE 104* 138*  BUN 20 15  CREATININE 0.91 1.04*  CALCIUM  9.3 8.8*   Liver Function Tests: Recent Labs  Lab 09/09/23 1734  AST 28  ALT 23  ALKPHOS 85  BILITOT 0.8  PROT 6.7  ALBUMIN 3.9   No results for input(s): LIPASE, AMYLASE in the last 168 hours. No results for input(s): AMMONIA in the last 168 hours. CBC: Recent Labs  Lab 09/09/23 1734 09/10/23 0746  WBC 5.9 5.6  NEUTROABS 2.7  --   HGB 12.2 12.1  HCT 39.0 37.6  MCV 87.2 85.6  PLT 223 220   Cardiac Enzymes: No results for input(s): CKTOTAL, CKMB, CKMBINDEX, TROPONINI in the last 168 hours. BNP: Invalid input(s):  POCBNP CBG: Recent Labs  Lab 09/09/23 1624  GLUCAP 116*   D-Dimer No results for input(s): DDIMER in the last 72 hours. Hgb A1c Recent Labs    09/10/23 0746  HGBA1C 5.9*   Lipid Profile Recent Labs    09/10/23 0746  CHOL 169  HDL 46  LDLCALC 87  TRIG 179*  CHOLHDL 3.7   Thyroid function studies No results for input(s): TSH, T4TOTAL, T3FREE, THYROIDAB in the last 72 hours.  Invalid input(s): FREET3 Anemia work up No results for input(s): VITAMINB12, FOLATE, FERRITIN, TIBC, IRON, RETICCTPCT in the last 72 hours.  Urinalysis    Component Value Date/Time   COLORURINE YELLOW 02/27/2023 2345   APPEARANCEUR CLEAR 02/27/2023 2345   LABSPEC 1.017 02/27/2023 2345   PHURINE 5.0 02/27/2023 2345   GLUCOSEU NEGATIVE 02/27/2023 2345   HGBUR NEGATIVE 02/27/2023 2345   BILIRUBINUR NEGATIVE 02/27/2023 2345   KETONESUR NEGATIVE 02/27/2023 2345   PROTEINUR NEGATIVE 02/27/2023 2345   NITRITE NEGATIVE 02/27/2023 2345   LEUKOCYTESUR NEGATIVE 02/27/2023 2345   Sepsis Labs Recent Labs  Lab 09/09/23 1734 09/10/23 0746  WBC 5.9 5.6   Microbiology No results found for this or any previous visit (from the past 240 hours).  Procedures/Studies: ECHOCARDIOGRAM COMPLETE Result Date: 09/10/2023    ECHOCARDIOGRAM REPORT   Patient Name:   TRULY STANKIEWICZ Date of Exam: 09/10/2023 Medical Rec #:  [id number]      Height:       69.0 in Accession #:    [id number]     Weight:       225.1 lb Date of Birth:  26-Oct-1940      BSA:          2.172 m Patient Age:    83 years       BP:           166/74 mmHg Patient Gender: F              HR:           65 bpm. Exam Location:  Inpatient Procedure: 2D Echo (Both Spectral and Color Flow Doppler were utilized during            procedure). Indications:    stroke  History:        Patient has no prior history of Echocardiogram examinations.  Sonographer:    Tinnie Barefoot RDCS Referring Phys: [id number] SUBRINA SUNDIL IMPRESSIONS  1. Left  ventricular ejection fraction, by estimation, is 60 to 65%. The left ventricle has normal function. The left ventricle has no regional wall motion abnormalities. Left ventricular diastolic parameters are indeterminate. Elevated left atrial pressure. The E/e' is 22.  2. Right ventricular systolic function is normal. The right ventricular size is normal. There is normal pulmonary artery systolic pressure. The estimated right ventricular systolic pressure is 15.1 mmHg.  3. The mitral valve is degenerative. No evidence of mitral valve regurgitation. No evidence of mitral stenosis.  4. The aortic valve is tricuspid. Aortic valve regurgitation is not visualized. Aortic valve sclerosis is present, with no evidence of aortic valve stenosis.  5. The inferior vena cava is normal in size with greater than 50% respiratory variability, suggesting right atrial pressure of 3 mmHg. Comparison(s): No prior Echocardiogram. Conclusion(s)/Recommendation(s): No intracardiac source of embolism detected on this transthoracic study. Consider a transesophageal echocardiogram to exclude cardiac source of embolism if clinically indicated. FINDINGS  Left Ventricle: Left ventricular ejection fraction, by estimation, is 60 to 65%. The left ventricle has normal function. The left ventricle has no regional wall motion abnormalities. The left ventricular internal cavity size was normal in size. There is  borderline left ventricular hypertrophy. Left ventricular diastolic parameters are indeterminate. Elevated left atrial pressure. The E/e' is 22. Right Ventricle: The right ventricular size is normal. No increase in right ventricular wall thickness. Right ventricular systolic function is normal. There is normal pulmonary artery systolic pressure. The tricuspid regurgitant velocity is 1.74 m/s, and  with an assumed right atrial pressure of 3 mmHg, the estimated right ventricular systolic pressure is 15.1 mmHg. Left Atrium: Left atrial size was normal  in size.  Right Atrium: Right atrial size was normal in size. Pericardium: Trivial pericardial effusion is present. Mitral Valve: The mitral valve is degenerative in appearance. Mild mitral annular calcification. No evidence of mitral valve regurgitation. No evidence of mitral valve stenosis. Tricuspid Valve: The tricuspid valve is normal in structure. Tricuspid valve regurgitation is trivial. No evidence of tricuspid stenosis. Aortic Valve: The aortic valve is tricuspid. Aortic valve regurgitation is not visualized. Aortic valve sclerosis is present, with no evidence of aortic valve stenosis. Pulmonic Valve: The pulmonic valve was grossly normal. Pulmonic valve regurgitation is not visualized. No evidence of pulmonic stenosis. Aorta: The aortic root and ascending aorta are structurally normal, with no evidence of dilitation. Venous: The inferior vena cava is normal in size with greater than 50% respiratory variability, suggesting right atrial pressure of 3 mmHg. IAS/Shunts: The atrial septum is grossly normal.  LEFT VENTRICLE PLAX 2D LVIDd:         4.80 cm   Diastology LVIDs:         2.60 cm   LV e' medial:    4.03 cm/s LV PW:         1.10 cm   LV E/e' medial:  24.7 LV IVS:        1.40 cm   LV e' lateral:   5.33 cm/s LVOT diam:     2.00 cm   LV E/e' lateral: 18.6 LV SV:         83 LV SV Index:   38 LVOT Area:     3.14 cm  RIGHT VENTRICLE             IVC RV Basal diam:  3.20 cm     IVC diam: 1.20 cm RV S prime:     19.90 cm/s TAPSE (M-mode): 1.6 cm LEFT ATRIUM             Index        RIGHT ATRIUM           Index LA diam:        4.20 cm 1.93 cm/m   RA Area:     14.80 cm LA Vol (A2C):   60.6 ml 27.90 ml/m  RA Volume:   34.10 ml  15.70 ml/m LA Vol (A4C):   66.6 ml 30.66 ml/m LA Biplane Vol: 66.5 ml 30.61 ml/m  AORTIC VALVE LVOT Vmax:   111.00 cm/s LVOT Vmean:  74.100 cm/s LVOT VTI:    0.264 m  AORTA Ao Root diam: 2.90 cm Ao Asc diam:  3.10 cm MITRAL VALVE                TRICUSPID VALVE MV Area (PHT): 3.12 cm      TR Peak grad:   12.1 mmHg MV Decel Time: 243 msec     TR Vmax:        174.00 cm/s MV E velocity: 99.40 cm/s MV A velocity: 134.00 cm/s  SHUNTS MV E/A ratio:  0.74         Systemic VTI:  0.26 m                             Systemic Diam: 2.00 cm Sunit Tolia Electronically signed by Madonna Large Signature Date/Time: 09/10/2023/11:35:53 AM    Final    DG Knee 1-2 Views Left Result Date: 09/09/2023 CLINICAL DATA:  Knot on the knee, uncontrollable twitching and moving EXAM: LEFT KNEE - 1-2 VIEW COMPARISON:  None Available.  FINDINGS: Osteopenia.No acute fracture or dislocation. Trace joint effusion. Moderate tricompartmental joint space loss with osteophyte formation. Quadriceps insertion enthesophyte. Soft tissues are unremarkable. IMPRESSION: 1. Trace joint effusion.  No acute fracture or dislocation. 2. Moderate tricompartmental osteoarthritis of the knee. Electronically Signed   By: Rogelia Myers M.D.   On: 09/09/2023 21:26   DG Shoulder Left Result Date: 09/09/2023 CLINICAL DATA:  144754 Shoulder pain [id number] EXAM: LEFT SHOULDER - 2+ VIEW COMPARISON:  October 26, 2014 FINDINGS: Osteopenia.No acute fracture or dislocation. Moderate AC joint space loss, unchanged. Apparent cardiomegaly. IMPRESSION: 1. Osteopenia.  No acute fracture or dislocation. 2. Moderate osteoarthritis of the AC joint. Electronically Signed   By: Rogelia Myers M.D.   On: 09/09/2023 21:24   CT ANGIO HEAD NECK W WO CM Result Date: 09/09/2023 CLINICAL DATA:  Transient ischemic attack, decreased sensation. Acute versus early subacute infarct in the right basal ganglia. EXAM: CT ANGIOGRAPHY HEAD AND NECK WITH AND WITHOUT CONTRAST TECHNIQUE: Multidetector CT imaging of the head and neck was performed using the standard protocol during bolus administration of intravenous contrast. Multiplanar CT image reconstructions and MIPs were obtained to evaluate the vascular anatomy. Carotid stenosis measurements (when applicable) are obtained utilizing NASCET  criteria, using the distal internal carotid diameter as the denominator. RADIATION DOSE REDUCTION: This exam was performed according to the departmental dose-optimization program which includes automated exposure control, adjustment of the mA and/or kV according to patient size and/or use of iterative reconstruction technique. CONTRAST:  75mL OMNIPAQUE  IOHEXOL  350 MG/ML SOLN COMPARISON:  MRI head 09/09/2023. FINDINGS: CT HEAD FINDINGS Brain: No acute intracranial hemorrhage. No CT evidence of acute infarct. No edema, mass effect, or midline shift. The basilar cisterns are patent. Ventricles: The ventricles are normal. Vascular: No hyperdense vessel or unexpected calcification. Skull: No acute or aggressive finding. Orbits: Bilateral lens replacement. Sinuses: Mild mucosal thickening in the ethmoid sinuses. Other: Mastoid air cells are clear. CTA NECK FINDINGS Aortic arch: Common origin of the brachiocephalic and left common carotid arteries. Imaged portion shows no evidence of aneurysm or dissection. Mild atherosclerosis of the visualized aortic arch. No significant stenosis of the major arch vessel origins. Pulmonary arteries: As permitted by contrast timing, there are no filling defects in the visualized pulmonary arteries. Subclavian arteries: The subclavian arteries are patent bilaterally. Right carotid system: No evidence of dissection, stenosis (50% or greater), or occlusion. Tortuosity of the proximal common carotid artery. Mild atherosclerosis along the proximal cervical ICA without hemodynamically significant stenosis. Mild tortuosity of the cervical ICA. Left carotid system: No evidence of dissection, stenosis (50% or greater), or occlusion. Calcified and noncalcified atherosclerotic plaque at the carotid bifurcation without hemodynamically significant stenosis. Vertebral arteries: Codominant. No evidence of dissection, stenosis (50% or greater), or occlusion. Skeleton: No acute findings. Degenerative  changes in the cervical spine. Edentulous maxilla with associated chronic bone loss. Other neck: The visualized airway is patent. No cervical lymphadenopathy. Upper chest: Visualized lung apices are clear. Review of the MIP images confirms the above findings CTA HEAD FINDINGS ANTERIOR CIRCULATION: The intracranial internal carotid arteries are patent bilaterally. Minimal atherosclerosis of the carotid siphons. No significant stenosis. MCAs: The middle cerebral arteries are patent bilaterally. ACAs: The anterior cerebral arteries are patent bilaterally. POSTERIOR CIRCULATION: No significant stenosis, proximal occlusion, aneurysm, or vascular malformation. PCAs: Patent bilaterally. Long segment mild-to-moderate stenosis of the left P1 segment. Additional irregularity and moderate stenosis of the left P2 segment. Pcomm: Not well visualized. SCAs: The superior cerebellar arteries are patent bilaterally. Basilar artery:  Patent AICAs: Visualized on the right. PICAs: Visualized on the left. Vertebral arteries: The intracranial vertebral arteries are patent. Venous sinuses: As permitted by contrast timing, patent. Anatomic variants: None Review of the MIP images confirms the above findings IMPRESSION: No large vessel occlusion. 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 No CT evidence of acute intracranial abnormality. Infarct in the right basal ganglia noted on MRI is not well visualized on the current study. Electronically Signed   By: Donnice Mania M.D.   On: 09/09/2023 20:44   MR BRAIN WO CONTRAST Result Date: 09/09/2023 CLINICAL DATA:  Transient ischemic attack (TIA) Left arm / leg weakness - intermittent x 4 days EXAM: MRI HEAD WITHOUT CONTRAST TECHNIQUE: Multiplanar, multiecho pulse sequences of the brain and surrounding structures were obtained without intravenous contrast. COMPARISON:  CT head 02/28/2023. FINDINGS: Brain:  Small acute or early subacute infarct in the right basal ganglia (series 5, image 31). Mild associated edema without mass effect. No midline shift, acute hemorrhage, mass lesion or hydrocephalus. Vascular: Major arterial flow voids are maintained at the skull base. Skull and upper cervical spine: Normal marrow signal. Sinuses/Orbits: Mild paranasal sinus mucosal thickening. No acute orbital findings. 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 Electronically Signed   By: Gilmore GORMAN Molt M.D.   On: 09/09/2023 20:01     Time coordinating discharge: Over 30 minutes    Alm Apo, MD  [facility] 09/10/2023, 12:58 PM

## 2023-09-13 ENCOUNTER — Ambulatory Visit

## 2023-09-13 LAB — I-STAT CHEM 8, ED
BUN: 19 mg/dL (ref 8–23)
Calcium, Ion: 1.2 mmol/L (ref 1.15–1.40)
Chloride: 107 mmol/L (ref 98–111)
Creatinine, Ser: 1.1 mg/dL — ABNORMAL HIGH (ref 0.44–1.00)
Glucose, Bld: 100 mg/dL — ABNORMAL HIGH (ref 70–99)
HCT: 35 % — ABNORMAL LOW (ref 36.0–46.0)
Hemoglobin: 11.9 g/dL — ABNORMAL LOW (ref 12.0–15.0)
Potassium: 3.9 mmol/L (ref 3.5–5.1)
Sodium: 142 mmol/L (ref 135–145)
TCO2: 25 mmol/L (ref 22–32)

## 2023-09-28 ENCOUNTER — Other Ambulatory Visit: Payer: Self-pay

## 2023-09-28 ENCOUNTER — Encounter: Payer: Self-pay | Admitting: Physical Therapy

## 2023-09-28 ENCOUNTER — Ambulatory Visit: Admitting: Physical Therapy

## 2023-09-28 ENCOUNTER — Ambulatory Visit: Attending: Internal Medicine | Admitting: Physical Therapy

## 2023-09-28 DIAGNOSIS — M6281 Muscle weakness (generalized): Secondary | ICD-10-CM | POA: Insufficient documentation

## 2023-09-28 DIAGNOSIS — R262 Difficulty in walking, not elsewhere classified: Secondary | ICD-10-CM | POA: Diagnosis present

## 2023-09-28 DIAGNOSIS — I639 Cerebral infarction, unspecified: Secondary | ICD-10-CM | POA: Insufficient documentation

## 2023-09-28 DIAGNOSIS — R2681 Unsteadiness on feet: Secondary | ICD-10-CM | POA: Insufficient documentation

## 2023-09-28 NOTE — Progress Notes (Signed)
   09/28/23 0001  Standardized Balance Assessment  Standardized Balance Assessment Dynamic Gait Index  Dynamic Gait Index  Level Surface 2  Change in Gait Speed 2  Gait with Horizontal Head Turns 1  Gait with Vertical Head Turns 0  Gait and Pivot Turn 2  Step Over Obstacle 2  Step Around Obstacles 1  Steps 2  Total Score 12

## 2023-09-28 NOTE — Therapy (Signed)
 OUTPATIENT PHYSICAL THERAPY LOWER EXTREMITY EVALUATION   Patient Name: Anastyn Ayars MRN: [id number] DOB:1940-07-13, 83 y.o., female Today's Date: 09/28/2023  END OF SESSION:  PT End of Session - 09/28/23 0844     Visit Number 1    Number of Visits 17    Date for PT Re-Evaluation 11/23/23    Authorization Type [employer] [facility]    Authorization Time Period 09/28/23 to 11/23/23    Authorization - Number of Visits 10    PT Start Time 0803    PT Stop Time 0842    PT Time Calculation (min) 39 min    Activity Tolerance Patient tolerated treatment well    Behavior During Therapy Impulsive;Anxious;Flat affect          Past Medical History:  Diagnosis Date   Hypertension    Past Surgical History:  Procedure Laterality Date   TUBAL LIGATION     Patient Active Problem List   Diagnosis Date Noted   Acute CVA (cerebrovascular accident) (HCC) 09/09/2023   Left shoulder pain 09/09/2023   Knee joint pain 09/09/2023   Chronic pain syndrome 09/09/2023    PCP: Joshua Francisco MD   REFERRING PROVIDER: Patsy Lenis, MD  REFERRING DIAG: I63.9 (ICD-10-CM) - Cerebrovascular accident (CVA), unspecified mechanism (HCC  THERAPY DIAG:  Unsteadiness on feet  Difficulty in walking, not elsewhere classified  Muscle weakness (generalized)  Rationale for Evaluation and Treatment: Rehabilitation  ONSET DATE: CVA found on imaging 09/09/23  SUBJECTIVE:   SUBJECTIVE STATEMENT: Still having a lot of fatigue since the stroke. L leg is burning more after the stroke, worse in the mornings but staying about the same. Yesterday was a better day. I was getting the left shoulder looked at before the CVA, it still hurts. Having a hard time sitting for a long period of time due to LE burning. Moving LE helps burning.   PERTINENT HISTORY: See above  PAIN:  Are you having pain? Yes: NPRS scale: 6/10 Pain location: L LE Pain description: burning  Aggravating factors: sitting for a long time  Relieving  factors: movement   PRECAUTIONS: Fall  RED FLAGS: None   WEIGHT BEARING RESTRICTIONS: No  FALLS:  Has patient fallen in last 6 months? 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  DIAGNOSTIC FINDINGS:   CLINICAL DATA:  144754 Shoulder pain 144754   EXAM: LEFT SHOULDER - 2+ VIEW   COMPARISON:  October 26, 2014   FINDINGS: Osteopenia.No acute fracture or dislocation. Moderate AC joint space loss, unchanged. Apparent cardiomegaly.   IMPRESSION: 1. Osteopenia.  No acute fracture or dislocation. 2. Moderate osteoarthritis of the AC joint.  CLINICAL DATA:  Knot on the knee, uncontrollable twitching and moving   EXAM: LEFT KNEE - 1-2 VIEW   COMPARISON:  None Available.   FINDINGS: Osteopenia.No acute fracture or dislocation. Trace joint effusion. Moderate tricompartmental joint space loss with osteophyte formation. Quadriceps insertion enthesophyte. Soft tissues are unremarkable.   IMPRESSION: 1. Trace joint effusion.  No acute fracture or dislocation.  2. Moderate tricompartmental osteoarthritis of the knee.  CLINICAL DATA:  Transient ischemic attack (TIA) Left arm / leg weakness - intermittent x 4 days   EXAM: MRI HEAD WITHOUT CONTRAST   TECHNIQUE: Multiplanar, multiecho pulse sequences of the brain and surrounding structures were obtained without intravenous contrast.   COMPARISON:  CT head 02/28/2023.   FINDINGS: Brain: Small acute  or early subacute infarct in the right basal ganglia (series 5, image 31). Mild associated edema without mass effect. No midline shift, acute hemorrhage, mass lesion or hydrocephalus.   Vascular: Major arterial flow voids are maintained at the skull base.   Skull and upper cervical spine: Normal marrow signal.   Sinuses/Orbits: Mild paranasal sinus mucosal thickening. No acute orbital findings.   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  PATIENT SURVEYS:  PSFS: THE PATIENT SPECIFIC FUNCTIONAL SCALE  Place score of 0-10 (0 = unable to perform activity and 10 = able to perform activity at the same level as before injury or problem)  Activity Date: 09/28/23    Sitting too long  5    2. Stamina for regular activities  3    3. Walking for exercise  3    4.      Total Score 3.7      Total Score = Sum of activity scores/number of activities  Minimally Detectable Change: 3 points (for single activity); 2 points (for average score)  [facility] (nd). The Patient Specific Functional Scale . Retrieved from [email or web address]   COGNITION: Overall cognitive status: impulsive, poor safety awareness and insight into deficits      SENSATION: Not tested    LOWER EXTREMITY MMT:  MMT Right eval Left eval  Hip flexion 3 3  Hip extension    Hip abduction    Hip adduction    Hip internal rotation    Hip external rotation    Knee flexion 5 4  Knee extension 5 4  Ankle dorsiflexion 5 5  Ankle plantarflexion    Ankle inversion    Ankle eversion     (Blank rows = not tested)    FUNCTIONAL TESTS:  3 minute walk test: 540ft no device, intermittently unsteady and reaching for walls, intermittent MinA  Dynamic Gait Index:     09/28/23 0001  Standardized Balance Assessment  Standardized Balance Assessment Dynamic Gait Index  Dynamic Gait Index  Level Surface 2  Change in  Gait Speed 2  Gait with Horizontal Head Turns 1  Gait with Vertical Head Turns 0  Gait and Pivot Turn 2  Step Over Obstacle 2  Step Around Obstacles 1  Steps 2  Total Score 12       GAIT: Distance walked: 550ft  Assistive device utilized: None Level of assistance: Min A Comments: unsteady in general without device  TREATMENT DATE:   09/28/23  Eval, POC, HEP and education as below    Nustep L5 x6 minutes BLEs only seat 10  PATIENT EDUCATION:  Education details: exam, POC, HEP, education about fall risk and recovery post CVA, use RW ideally to reduce fall risk  Person educated: Patient and Child(ren) Education method: Explanation, Demonstration, and Handouts Education comprehension: verbalized understanding, returned demonstration, and needs further education  HOME EXERCISE PROGRAM: TBD  ASSESSMENT:  CLINICAL IMPRESSION: Patient is a 83 y.o. F who was seen today for physical therapy evaluation and treatment for I63.9 (ICD-10-CM) - Cerebrovascular accident (CVA), unspecified mechanism (HCC. Very high fall risk, required a lot of education and cues for safety. Daughter present for eval and very supportive, will be doing pool walking and exercise with her. Anticipate with family support that she will improve with skilled PT services.    OBJECTIVE IMPAIRMENTS: Abnormal gait, decreased activity tolerance, decreased balance, decreased cognition, decreased coordination, decreased mobility, difficulty walking, decreased strength, and decreased safety awareness.   ACTIVITY LIMITATIONS: sitting, standing, squatting, transfers, and locomotion level  PARTICIPATION LIMITATIONS: meal prep, cleaning, driving, shopping, community activity, and yard work  PERSONAL FACTORS: Age, Behavior pattern, Education, Fitness, Past/current experiences, and Time since onset of  injury/illness/exacerbation are also affecting patient's functional outcome.   REHAB POTENTIAL: Good  CLINICAL DECISION MAKING: Stable/uncomplicated  EVALUATION COMPLEXITY: Low   GOALS: Goals reviewed with patient? No  SHORT TERM GOALS: Target date: 10/26/2023   Will be compliant with appropriate progressive HEP  Baseline: Goal status: INITIAL  2.  Will be able to name 3 ways to reduce fall risk at home and in the community  Baseline:  Goal status: INITIAL  3.  Concerns regarding L LE burning and strength to have resolved by at least 75% Baseline:  Goal status: INITIAL  4.  Will be compliant with use of appropriate LRAD to reduce fall risk  Baseline:  Goal status: INITIAL    LONG TERM GOALS: Target date: 11/23/2023    MMT to be 5/5 in all tested groups  Baseline:  Goal status: INITIAL  2.  Will score at least 18 on DGI to show reduced fall risk  Baseline:  Goal status: INITIAL  3.  Will have been able to return to walking for exercise with family as desired  Baseline:  Goal status: INITIAL  4.  PSFS to have improved by at least 2 points  Baseline:  Goal status: INITIAL     PLAN:  PT FREQUENCY: 2x/week  PT DURATION: 8 weeks  PLANNED INTERVENTIONS: 97750- Physical Performance Testing, 97110-Therapeutic exercises, 97530- Therapeutic activity, [id number]- Neuromuscular re-education, 97535- Self Care, 02859- Manual therapy, [id number]- Gait training, and [phone]- Aquatic Therapy  PLAN FOR NEXT SESSION: focus on balance and safety with functional mobility, strength and gait. Work on attention and safety awareness in general   Josette Rough, PT, DPT 09/28/23 8:46 AM

## 2023-09-30 ENCOUNTER — Ambulatory Visit: Admitting: Physical Therapy

## 2023-09-30 ENCOUNTER — Encounter: Payer: Self-pay | Admitting: Physical Therapy

## 2023-09-30 DIAGNOSIS — R262 Difficulty in walking, not elsewhere classified: Secondary | ICD-10-CM

## 2023-09-30 DIAGNOSIS — R2681 Unsteadiness on feet: Secondary | ICD-10-CM | POA: Diagnosis not present

## 2023-09-30 DIAGNOSIS — M6281 Muscle weakness (generalized): Secondary | ICD-10-CM

## 2023-09-30 NOTE — Therapy (Signed)
 OUTPATIENT PHYSICAL THERAPY LOWER EXTREMITY TREATMENT    Patient Name: Courtney Holmes MRN: [id number] DOB:1941-01-30, 83 y.o., female Today's Date: 09/30/2023  END OF SESSION:  PT End of Session - 09/30/23 1519     Visit Number 2    Number of Visits 17    Date for PT Re-Evaluation 11/23/23    Authorization Type [employer]    Authorization Time Period 09/28/23 to 11/23/23    Authorization - Number of Visits 10    PT Start Time 1439   late arrival   PT Stop Time 1518    PT Time Calculation (min) 39 min    Activity Tolerance Patient tolerated treatment well    Behavior During Therapy Impulsive;WFL for tasks assessed/performed           Past Medical History:  Diagnosis Date   Hypertension    Past Surgical History:  Procedure Laterality Date   TUBAL LIGATION     Patient Active Problem List   Diagnosis Date Noted   Acute CVA (cerebrovascular accident) (HCC) 09/09/2023   Left shoulder pain 09/09/2023   Knee joint pain 09/09/2023   Chronic pain syndrome 09/09/2023    PCP: Joshua Francisco MD   REFERRING PROVIDER: Patsy Lenis, MD  REFERRING DIAG: I63.9 (ICD-10-CM) - Cerebrovascular accident (CVA), unspecified mechanism (HCC  THERAPY DIAG:  Unsteadiness on feet  Difficulty in walking, not elsewhere classified  Muscle weakness (generalized)  Rationale for Evaluation and Treatment: Rehabilitation  ONSET DATE: CVA found on imaging 09/09/23  SUBJECTIVE:   SUBJECTIVE STATEMENT:  Went to the pool yesterday and it felt pretty good, felt good afterwards    EVAL: Still having a lot of fatigue since the stroke. L leg is burning more after the stroke, worse in the mornings but staying about the same. Yesterday was a better day. I was getting the left shoulder looked at before the CVA, it still hurts. Having a hard time sitting for a long period of time due to LE burning. Moving LE helps burning.   PERTINENT HISTORY: See above  PAIN:  Are you having pain? Yes: NPRS  scale: 5/10 Pain location: L LE Pain description: burning Aggravating factors: sitting for a long time  Relieving factors: movement   PRECAUTIONS: Fall  RED FLAGS: None   WEIGHT BEARING RESTRICTIONS: No  FALLS:  Has patient fallen in last 6 months? 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  DIAGNOSTIC FINDINGS:   CLINICAL DATA:  144754 Shoulder pain 144754   EXAM: LEFT SHOULDER - 2+ VIEW   COMPARISON:  October 26, 2014   FINDINGS: Osteopenia.No acute fracture or dislocation. Moderate AC joint space loss, unchanged. Apparent cardiomegaly.   IMPRESSION: 1. Osteopenia.  No acute fracture or dislocation. 2. Moderate osteoarthritis of the AC joint.  CLINICAL DATA:  Knot on the knee, uncontrollable twitching and moving   EXAM: LEFT KNEE - 1-2 VIEW   COMPARISON:  None Available.   FINDINGS: Osteopenia.No acute fracture or dislocation. Trace joint effusion. Moderate tricompartmental joint space  loss with osteophyte formation. Quadriceps insertion enthesophyte. Soft tissues are unremarkable.   IMPRESSION: 1. Trace joint effusion.  No acute fracture or dislocation. 2. Moderate tricompartmental osteoarthritis of the knee.  CLINICAL DATA:  Transient ischemic attack (TIA) Left arm / leg weakness - intermittent x 4 days   EXAM: MRI HEAD WITHOUT CONTRAST   TECHNIQUE: Multiplanar, multiecho pulse sequences of the brain and  surrounding structures were obtained without intravenous contrast.   COMPARISON:  CT head 02/28/2023.   FINDINGS: Brain: Small acute or early subacute infarct in the right basal ganglia (series 5, image 31). Mild associated edema without mass effect. No midline shift, acute hemorrhage, mass lesion or hydrocephalus.   Vascular: Major arterial flow voids are maintained at the skull base.   Skull and upper cervical spine: Normal marrow signal.   Sinuses/Orbits: Mild paranasal sinus mucosal thickening. No acute orbital findings.   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  PATIENT SURVEYS:  PSFS: THE PATIENT SPECIFIC FUNCTIONAL SCALE  Place score of 0-10 (0 = unable to perform activity and 10 = able to perform activity at the same level as before injury or problem)  Activity Date: 09/28/23    Sitting too long  5    2. Stamina for regular activities  3    3. Walking for exercise  3    4.      Total Score 3.7      Total Score = Sum of activity scores/number of activities  Minimally Detectable Change: 3 points (for single activity); 2 points (for average score)  [employer] [facility] (nd). The Patient Specific Functional Scale . Retrieved from [email or web address]   COGNITION: Overall cognitive status: impulsive, poor safety awareness and insight into deficits      SENSATION: Not tested    LOWER EXTREMITY MMT:  MMT Right eval Left eval  Hip flexion 3 3  Hip extension    Hip abduction    Hip adduction    Hip internal rotation    Hip external rotation    Knee flexion 5 4  Knee extension 5 4  Ankle dorsiflexion 5 5  Ankle plantarflexion    Ankle inversion    Ankle eversion     (Blank rows = not tested)    FUNCTIONAL TESTS:  3 minute walk test: 572ft no device, intermittently unsteady and reaching for walls, intermittent MinA  Dynamic Gait Index:     09/28/23 0001   Standardized Balance Assessment  Standardized Balance Assessment Dynamic Gait Index  Dynamic Gait Index  Level Surface 2  Change in Gait Speed 2  Gait with Horizontal Head Turns 1  Gait with Vertical Head Turns 0  Gait and Pivot Turn 2  Step Over Obstacle 2  Step Around Obstacles 1  Steps 2  Total Score 12       GAIT: Distance walked: 543ft  Assistive device utilized: None Level of assistance: Min A Comments: unsteady in general without device  TREATMENT DATE:   09/30/23  Nustep L5x8 minutes all four extremities, seat 10  Bridge + ABD into green TB x10 Sidelying clams green TB x10 B Seated LAQs green TB x10 B  Tandem stance solid surface 2x30 seconds B Side steps in // bars x4 laps solid surface  Alternating toe taps 4 inch box x20 solid surface  Forward step ups onto blue foam pad x10 B Lateral step ups onto blue foam pad x10 B  09/28/23  Eval, POC, HEP and education as below    Nustep L5 x6 minutes BLEs only seat 10  PATIENT EDUCATION:  Education details: exam, POC, HEP, education about fall risk and recovery post CVA, use RW ideally to reduce fall risk  Person educated: Patient and Child(ren) Education method: Explanation, Demonstration, and Handouts Education comprehension: verbalized understanding, returned demonstration, and needs further education  HOME EXERCISE PROGRAM:  Access Code: [email or web address] URL: [email or web address] Date: 09/30/2023 Prepared by: Josette Rough  Exercises - Supine Bridge with Resistance Band  - 1 x daily - 7 x weekly - 1-2 sets - 10 reps - 2 seconds  hold - Clamshell with Resistance  - 1 x daily - 7 x weekly - 1-2 sets - 10 reps - 1 seconds  hold - Seated Knee Extension with Anchored Resistance  - 1 x daily - 7 x weekly - 1-2 sets - 10 reps - 2 seconds  hold - Tandem Stance in Corner  - 1  x daily - 7 x weekly - 1-2 sets - 6 reps - 15-30 seconds  hold  ASSESSMENT:  CLINICAL IMPRESSION:  Arrived today doing well, looked a bit more steady when walking. Worked on functional activity tolerance, balance, and strength as per POC today, did well but continues to struggle with coordination. Needed up to multimodal Mod cues for good sequencing and step by step movements for good form. Assigned HEP today as well- will continue to progress as able.    EVAL: Patient is a 83 y.o. F who was seen today for physical therapy evaluation and treatment for I63.9 (ICD-10-CM) - Cerebrovascular accident (CVA), unspecified mechanism (HCC. Very high fall risk, required a lot of education and cues for safety. Daughter present for eval and very supportive, will be doing pool walking and exercise with her. Anticipate with family support that she will improve with skilled PT services.    OBJECTIVE IMPAIRMENTS: Abnormal gait, decreased activity tolerance, decreased balance, decreased cognition, decreased coordination, decreased mobility, difficulty walking, decreased strength, and decreased safety awareness.   ACTIVITY LIMITATIONS: sitting, standing, squatting, transfers, and locomotion level  PARTICIPATION LIMITATIONS: meal prep, cleaning, driving, shopping, community activity, and yard work  PERSONAL FACTORS: Age, Behavior pattern, Education, Fitness, Past/current experiences, and Time since onset of injury/illness/exacerbation are also affecting patient's functional outcome.   REHAB POTENTIAL: Good  CLINICAL DECISION MAKING: Stable/uncomplicated  EVALUATION COMPLEXITY: Low   GOALS: Goals reviewed with patient? No  SHORT TERM GOALS: Target date: 10/26/2023   Will be compliant with appropriate progressive HEP  Baseline: Goal status: INITIAL  2.  Will be able to name 3 ways to reduce fall risk at home and in the community  Baseline:  Goal status: INITIAL  3.  Concerns regarding L LE burning  and strength to have resolved by at least 75% Baseline:  Goal status: INITIAL  4.  Will be compliant with use of appropriate LRAD to reduce fall risk  Baseline:  Goal status: INITIAL    LONG TERM GOALS: Target date: 11/23/2023  MMT to be 5/5 in all tested groups  Baseline:  Goal status: INITIAL  2.  Will score at least 18 on DGI to show reduced fall risk  Baseline:  Goal status: INITIAL  3.  Will have been able to return to walking for exercise with family as desired  Baseline:  Goal status: INITIAL  4.  PSFS to have improved by at least 2 points  Baseline:  Goal status: INITIAL     PLAN:  PT FREQUENCY: 2x/week  PT DURATION: 8 weeks  PLANNED INTERVENTIONS: 97750- Physical Performance Testing, 97110-Therapeutic exercises, 97530- Therapeutic activity, [id number]- Neuromuscular re-education, 97535- Self Care, 02859- Manual therapy, [id number]- Gait training, and [phone]- Aquatic Therapy  PLAN FOR NEXT SESSION: focus on balance and safety with functional mobility, strength and gait. Work on attention and safety awareness in general, slow her down and work on sequencing and coordination   Josette Rough, PT, DPT 09/30/23 3:20 PM

## 2023-10-04 ENCOUNTER — Ambulatory Visit

## 2023-10-12 ENCOUNTER — Ambulatory Visit: Attending: Internal Medicine | Admitting: Physical Therapy

## 2023-10-12 DIAGNOSIS — R262 Difficulty in walking, not elsewhere classified: Secondary | ICD-10-CM | POA: Insufficient documentation

## 2023-10-12 DIAGNOSIS — M6281 Muscle weakness (generalized): Secondary | ICD-10-CM | POA: Insufficient documentation

## 2023-10-12 DIAGNOSIS — R2681 Unsteadiness on feet: Secondary | ICD-10-CM | POA: Insufficient documentation

## 2023-10-14 ENCOUNTER — Ambulatory Visit: Admitting: Physical Therapy

## 2023-10-14 ENCOUNTER — Encounter: Payer: Self-pay | Admitting: Physical Therapy

## 2023-10-14 DIAGNOSIS — R2681 Unsteadiness on feet: Secondary | ICD-10-CM

## 2023-10-14 DIAGNOSIS — M6281 Muscle weakness (generalized): Secondary | ICD-10-CM

## 2023-10-14 DIAGNOSIS — R262 Difficulty in walking, not elsewhere classified: Secondary | ICD-10-CM

## 2023-10-14 NOTE — Therapy (Signed)
 Per subjective 10/14/23:  Courtney Holmes at [facility]  10/08/23 (chair broke and fell straight back), hit head and back, arms shoulders. Urgent care did imaging and found no fractures. Saw neurologist and they told us  to come back to PT to see what she can and can't do. Going back to neurologist on the 14th, this doctor wanted to see what she can and can't do. Needs a lot more help getting dressed, limping a lot.   Attempted nustep on lowest resistance settings but pt was unable to tolerate and did not want to continue with visit.  Assisted pt and daughter in ambulating out to the car, had our front desk cancel all PT appts up to next neurologist appt. Will await neurologist assessment/reccs and if appropriate will try PT again at that time.  No charge for today's visit  Josette Rough, PT, DPT 10/14/23 8:27 AM

## 2023-10-19 ENCOUNTER — Ambulatory Visit

## 2023-10-21 ENCOUNTER — Ambulatory Visit: Admitting: Physical Therapy

## 2023-10-25 ENCOUNTER — Ambulatory Visit

## 2023-10-25 NOTE — Therapy (Incomplete)
 OUTPATIENT PHYSICAL THERAPY LOWER EXTREMITY TREATMENT    Patient Name: Courtney Holmes MRN: [id number] DOB:1941-02-05, 83 y.o., female Today's Date: 10/25/2023  END OF SESSION:     Past Medical History:  Diagnosis Date   Hypertension    Past Surgical History:  Procedure Laterality Date   TUBAL LIGATION     Patient Active Problem List   Diagnosis Date Noted   Acute CVA (cerebrovascular accident) (HCC) 09/09/2023   Left shoulder pain 09/09/2023   Knee joint pain 09/09/2023   Chronic pain syndrome 09/09/2023    PCP: Joshua Francisco MD   REFERRING PROVIDER: Patsy Lenis, MD  REFERRING DIAG: I63.9 (ICD-10-CM) - Cerebrovascular accident (CVA), unspecified mechanism (HCC  THERAPY DIAG:  No diagnosis found.  Rationale for Evaluation and Treatment: Rehabilitation  ONSET DATE: CVA found on imaging 09/09/23  SUBJECTIVE:   SUBJECTIVE STATEMENT:  Went to the pool yesterday and it felt pretty good, felt good afterwards    EVAL: Still having a lot of fatigue since the stroke. L leg is burning more after the stroke, worse in the mornings but staying about the same. Yesterday was a better day. I was getting the left shoulder looked at before the CVA, it still hurts. Having a hard time sitting for a long period of time due to LE burning. Moving LE helps burning.   PERTINENT HISTORY: See above  PAIN:  Are you having pain? Yes: NPRS scale: 5/10 Pain location: L LE Pain description: burning Aggravating factors: sitting for a long time  Relieving factors: movement   PRECAUTIONS: Fall  RED FLAGS: None   WEIGHT BEARING RESTRICTIONS: No  FALLS:  Has patient fallen in last 6 months? 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  DIAGNOSTIC FINDINGS:   CLINICAL DATA:  144754 Shoulder pain 144754   EXAM: LEFT SHOULDER - 2+ VIEW   COMPARISON:  October 26, 2014   FINDINGS: Osteopenia.No acute fracture or dislocation. Moderate AC joint space loss, unchanged. Apparent cardiomegaly.   IMPRESSION: 1. Osteopenia.  No acute fracture or dislocation. 2. Moderate osteoarthritis of the AC joint.  CLINICAL DATA:  Knot on the knee, uncontrollable twitching and moving   EXAM: LEFT KNEE - 1-2 VIEW   COMPARISON:  None Available.   FINDINGS: Osteopenia.No acute fracture or dislocation. Trace joint effusion. Moderate tricompartmental joint space loss with osteophyte formation. Quadriceps insertion enthesophyte. Soft tissues are unremarkable.   IMPRESSION: 1. Trace joint effusion.  No acute fracture or dislocation. 2. Moderate tricompartmental osteoarthritis of the knee.  CLINICAL DATA:  Transient ischemic attack (TIA) Left arm / leg weakness - intermittent x 4 days   EXAM: MRI HEAD WITHOUT CONTRAST   TECHNIQUE: Multiplanar, multiecho pulse sequences of the brain and surrounding structures were obtained without intravenous contrast.   COMPARISON:  CT head 02/28/2023.   FINDINGS: Brain: Small acute or early subacute infarct in the right basal ganglia (series 5, image 31). Mild associated edema without mass effect. No midline shift, acute hemorrhage, mass lesion or hydrocephalus.  Vascular: Major arterial flow voids are maintained at the skull base.   Skull and upper cervical spine: Normal marrow signal.   Sinuses/Orbits: Mild paranasal sinus mucosal thickening. No acute orbital findings.   IMPRESSION: Small acute or  early subacute infarct in the right basal ganglia.  PATIENT SURVEYS:  PSFS: THE PATIENT SPECIFIC FUNCTIONAL SCALE  Place score of 0-10 (0 = unable to perform activity and 10 = able to perform activity at the same level as before injury or problem)  Activity Date: 09/28/23    Sitting too long  5    2. Stamina for regular activities  3    3. Walking for exercise  3    4.      Total Score 3.7      Total Score = Sum of activity scores/number of activities  Minimally Detectable Change: 3 points (for single activity); 2 points (for average score)  [facility] (nd). The Patient Specific Functional Scale . Retrieved from [email or web address]   COGNITION: Overall cognitive status: impulsive, poor safety awareness and insight into deficits      SENSATION: Not tested    LOWER EXTREMITY MMT:  MMT Right eval Left eval  Hip flexion 3 3  Hip extension    Hip abduction    Hip adduction    Hip internal rotation    Hip external rotation    Knee flexion 5 4  Knee extension 5 4  Ankle dorsiflexion 5 5  Ankle plantarflexion    Ankle inversion    Ankle eversion     (Blank rows = not tested)    FUNCTIONAL TESTS:  3 minute walk test: 555ft no device, intermittently unsteady and reaching for walls, intermittent MinA  Dynamic Gait Index:     09/28/23 0001  Standardized Balance Assessment  Standardized Balance Assessment Dynamic Gait Index  Dynamic Gait Index  Level Surface 2  Change in Gait Speed 2  Gait with Horizontal Head Turns 1  Gait with Vertical Head Turns 0  Gait and Pivot Turn 2  Step Over Obstacle 2  Step Around Obstacles 1  Steps 2  Total Score 12       GAIT: Distance walked: 538ft  Assistive device utilized: None Level of assistance: Min A Comments: unsteady in general without device                                                                                                                                  TREATMENT DATE:  10/25/23 NuStep LAQ HS Curls Standing march and hip abd STS Ball squeeze   10/14/23 Per subjective 10/14/23:   [name] at [facility]  10/08/23 (chair broke and fell straight back), hit head and back, arms shoulders. Urgent care did imaging and found no fractures. Saw neurologist and they told us  to come back to PT to see what she can and can't do. Going back to neurologist on the 14th, this  doctor wanted to see what she can and can't do. Needs a lot more help getting dressed, limping a lot.    Attempted nustep on lowest resistance settings but pt was unable to tolerate and did not want to continue with visit.   Assisted pt and daughter in ambulating out to the car, had our front desk cancel all PT appts up to next neurologist appt. Will await neurologist assessment/reccs and if appropriate will try PT again at that time.   No charge for today's visit 09/30/23  Nustep L5x8 minutes all four extremities, seat 10  Bridge + ABD into green TB x10 Sidelying clams green TB x10 B Seated LAQs green TB x10 B  Tandem stance solid surface 2x30 seconds B Side steps in // bars x4 laps solid surface  Alternating toe taps 4 inch box x20 solid surface  Forward step ups onto blue foam pad x10 B Lateral step ups onto blue foam pad x10 B  09/28/23  Eval, POC, HEP and education as below    Nustep L5 x6 minutes BLEs only seat 10  PATIENT EDUCATION:  Education details: exam, POC, HEP, education about fall risk and recovery post CVA, use RW ideally to reduce fall risk  Person educated: Patient and Child(ren) Education method: Explanation, Demonstration, and Handouts Education comprehension: verbalized understanding, returned demonstration, and needs further education  HOME EXERCISE PROGRAM:  Access Code: [email or web address] URL: [email or web address] Date: 09/30/2023 Prepared by: Josette Rough  Exercises - Supine Bridge with Resistance Band   - 1 x daily - 7 x weekly - 1-2 sets - 10 reps - 2 seconds  hold - Clamshell with Resistance  - 1 x daily - 7 x weekly - 1-2 sets - 10 reps - 1 seconds  hold - Seated Knee Extension with Anchored Resistance  - 1 x daily - 7 x weekly - 1-2 sets - 10 reps - 2 seconds  hold - Tandem Stance in Corner  - 1 x daily - 7 x weekly - 1-2 sets - 6 reps - 15-30 seconds  hold  ASSESSMENT:  CLINICAL IMPRESSION:  Arrived today doing well, looked a bit more steady when walking. Worked on functional activity tolerance, balance, and strength as per POC today, did well but continues to struggle with coordination. Needed up to multimodal Mod cues for good sequencing and step by step movements for good form. Assigned HEP today as well- will continue to progress as able.    EVAL: Patient is a 83 y.o. F who was seen today for physical therapy evaluation and treatment for I63.9 (ICD-10-CM) - Cerebrovascular accident (CVA), unspecified mechanism (HCC. Very high fall risk, required a lot of education and cues for safety. Daughter present for eval and very supportive, will be doing pool walking and exercise with her. Anticipate with family support that she will improve with skilled PT services.    OBJECTIVE IMPAIRMENTS: Abnormal gait, decreased activity tolerance, decreased balance, decreased cognition, decreased coordination, decreased mobility, difficulty walking, decreased strength, and decreased safety awareness.   ACTIVITY LIMITATIONS: sitting, standing, squatting, transfers, and locomotion level  PARTICIPATION LIMITATIONS: meal prep, cleaning, driving, shopping, community activity, and yard work  PERSONAL FACTORS: Age, Behavior pattern, Education, Fitness, Past/current experiences, and Time since onset of injury/illness/exacerbation are also affecting patient's functional outcome.   REHAB POTENTIAL: Good  CLINICAL DECISION MAKING: Stable/uncomplicated  EVALUATION COMPLEXITY: Low   GOALS: Goals reviewed with  patient? No  SHORT TERM GOALS: Target date: 10/26/2023   Will be compliant with appropriate progressive HEP  Baseline: Goal status: INITIAL  2.  Will be able to name 3 ways to reduce fall risk at home and in the community  Baseline:  Goal status: INITIAL  3.  Concerns regarding L LE burning and strength to have resolved by at least 75% Baseline:  Goal status: INITIAL  4.  Will be compliant with use of appropriate LRAD to reduce fall risk  Baseline:  Goal status: INITIAL    LONG TERM GOALS: Target date: 11/23/2023    MMT to be 5/5 in all tested groups  Baseline:  Goal status: INITIAL  2.  Will score at least 18 on DGI to show reduced fall risk  Baseline:  Goal status: INITIAL  3.  Will have been able to return to walking for exercise with family as desired  Baseline:  Goal status: INITIAL  4.  PSFS to have improved by at least 2 points  Baseline:  Goal status: INITIAL     PLAN:  PT FREQUENCY: 2x/week  PT DURATION: 8 weeks  PLANNED INTERVENTIONS: 97750- Physical Performance Testing, 97110-Therapeutic exercises, 97530- Therapeutic activity, [id number]- Neuromuscular re-education, 97535- Self Care, [id number]- Manual therapy, [id number]- Gait training, and [phone]- Aquatic Therapy  PLAN FOR NEXT SESSION: focus on balance and safety with functional mobility, strength and gait. Work on attention and safety awareness in general, slow her down and work on sequencing and coordination   Josette Rough, PT, DPT 10/25/23 1:32 PM

## 2023-10-27 ENCOUNTER — Ambulatory Visit

## 2023-10-27 NOTE — Therapy (Incomplete)
 OUTPATIENT PHYSICAL THERAPY LOWER EXTREMITY TREATMENT    Patient Name: Courtney Holmes MRN: [id number] DOB:[birth year], 83 y.o., female Today's Date: 10/27/2023  END OF SESSION:     Past Medical History:  Diagnosis Date   Hypertension    Past Surgical History:  Procedure Laterality Date   TUBAL LIGATION     Patient Active Problem List   Diagnosis Date Noted   Acute CVA (cerebrovascular accident) (HCC) 09/09/2023   Left shoulder pain 09/09/2023   Knee joint pain 09/09/2023   Chronic pain syndrome 09/09/2023    PCP: Courtney Francisco MD   REFERRING PROVIDER: Patsy Lenis, MD  REFERRING DIAG: I63.9 (ICD-10-CM) - Cerebrovascular accident (CVA), unspecified mechanism (HCC  THERAPY DIAG:  No diagnosis found.  Rationale for Evaluation and Treatment: Rehabilitation  ONSET DATE: CVA found on imaging 09/09/23  SUBJECTIVE:   SUBJECTIVE STATEMENT:  Went to the pool yesterday and it felt pretty good, felt good afterwards    EVAL: Still having a lot of fatigue since the stroke. L leg is burning more after the stroke, worse in the mornings but staying about the same. Yesterday was a better day. I was getting the left shoulder looked at before the CVA, it still hurts. Having a hard time sitting for a long period of time due to LE burning. Moving LE helps burning.   PERTINENT HISTORY: See above  PAIN:  Are you having pain? Yes: NPRS scale: 5/10 Pain location: L LE Pain description: burning Aggravating factors: sitting for a long time  Relieving factors: movement   PRECAUTIONS: Fall  RED FLAGS: None   WEIGHT BEARING RESTRICTIONS: No  FALLS:  Has patient fallen in last 6 months? 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  DIAGNOSTIC FINDINGS:   CLINICAL DATA:  144754 Shoulder pain 144754   EXAM: LEFT SHOULDER - 2+ VIEW   COMPARISON:  October 26, 2014   FINDINGS: Osteopenia.No acute fracture or dislocation. Moderate AC joint space loss, unchanged. Apparent cardiomegaly.   IMPRESSION: 1. Osteopenia.  No acute fracture or dislocation. 2. Moderate osteoarthritis of the AC joint.  CLINICAL DATA:  Knot on the knee, uncontrollable twitching and moving   EXAM: LEFT KNEE - 1-2 VIEW   COMPARISON:  None Available.   FINDINGS: Osteopenia.No acute fracture or dislocation. Trace joint effusion. Moderate tricompartmental joint space loss with osteophyte formation. Quadriceps insertion enthesophyte. Soft tissues are unremarkable.   IMPRESSION: 1. Trace joint effusion.  No acute fracture or dislocation. 2. Moderate tricompartmental osteoarthritis of the knee.  CLINICAL DATA:  Transient ischemic attack (TIA) Left arm / leg weakness - intermittent x 4 days   EXAM: MRI HEAD WITHOUT CONTRAST   TECHNIQUE: Multiplanar, multiecho pulse sequences of the brain and surrounding structures were obtained without intravenous contrast.   COMPARISON:  CT head 02/28/2023.   FINDINGS: Brain: Small acute or early subacute infarct in the right basal ganglia (series 5, image 31). Mild associated edema without mass effect. No midline shift, acute hemorrhage, mass lesion or hydrocephalus.  Vascular: Major arterial flow voids are maintained at the skull base.   Skull and upper cervical spine: Normal marrow signal.   Sinuses/Orbits: Mild paranasal sinus mucosal thickening. No acute orbital findings.   IMPRESSION: Small acute or  early subacute infarct in the right basal ganglia.  PATIENT SURVEYS:  PSFS: THE PATIENT SPECIFIC FUNCTIONAL SCALE  Place score of 0-10 (0 = unable to perform activity and 10 = able to perform activity at the same level as before injury or problem)  Activity Date: 09/28/23    Sitting too long  5    2. Stamina for regular activities  3    3. Walking for exercise  3    4.      Total Score 3.7      Total Score = Sum of activity scores/number of activities  Minimally Detectable Change: 3 points (for single activity); 2 points (for average score)  [facility] (nd). The Patient Specific Functional Scale . Retrieved from [email or web address]   COGNITION: Overall cognitive status: impulsive, poor safety awareness and insight into deficits      SENSATION: Not tested    LOWER EXTREMITY MMT:  MMT Right eval Left eval  Hip flexion 3 3  Hip extension    Hip abduction    Hip adduction    Hip internal rotation    Hip external rotation    Knee flexion 5 4  Knee extension 5 4  Ankle dorsiflexion 5 5  Ankle plantarflexion    Ankle inversion    Ankle eversion     (Blank rows = not tested)    FUNCTIONAL TESTS:  3 minute walk test: 540ft no device, intermittently unsteady and reaching for walls, intermittent MinA  Dynamic Gait Index:     09/28/23 0001  Standardized Balance Assessment  Standardized Balance Assessment Dynamic Gait Index  Dynamic Gait Index  Level Surface 2  Change in Gait Speed 2  Gait with Horizontal Head Turns 1  Gait with Vertical Head Turns 0  Gait and Pivot Turn 2  Step Over Obstacle 2  Step Around Obstacles 1  Steps 2  Total Score 12       GAIT: Distance walked: 564ft  Assistive device utilized: None Level of assistance: Min A Comments: unsteady in general without device                                                                                                                                  TREATMENT DATE:  10/27/23 NuStep LAQ HS Curls Standing march and hip abd STS Ball squeeze   10/14/23 Per subjective 10/14/23:   Courtney Holmes at [facility]  10/08/23 (chair broke and fell straight back), hit head and back, arms shoulders. Urgent care did imaging and found no fractures. Saw neurologist and they told us  to come back to PT to see what she can and can't do. Going back to neurologist on the 14th, this  doctor wanted to see what she can and can't do. Needs a lot more help getting dressed, limping a lot.    Attempted nustep on lowest resistance settings but pt was unable to tolerate and did not want to continue with visit.   Assisted pt and daughter in ambulating out to the car, had our front desk cancel all PT appts up to next neurologist appt. Will await neurologist assessment/reccs and if appropriate will try PT again at that time.   No charge for today's visit 09/30/23  Nustep L5x8 minutes all four extremities, seat 10  Bridge + ABD into green TB x10 Sidelying clams green TB x10 B Seated LAQs green TB x10 B  Tandem stance solid surface 2x30 seconds B Side steps in // bars x4 laps solid surface  Alternating toe taps 4 inch box x20 solid surface  Forward step ups onto blue foam pad x10 B Lateral step ups onto blue foam pad x10 B  09/28/23  Eval, POC, HEP and education as below    Nustep L5 x6 minutes BLEs only seat 10  PATIENT EDUCATION:  Education details: exam, POC, HEP, education about fall risk and recovery post CVA, use RW ideally to reduce fall risk  Person educated: Patient and Child(ren) Education method: Explanation, Demonstration, and Handouts Education comprehension: verbalized understanding, returned demonstration, and needs further education  HOME EXERCISE PROGRAM:  Access Code: [email or web address] URL: [email or web address] Date: 09/30/2023 Prepared by: Josette Rough  Exercises - Supine Bridge with Resistance Band   - 1 x daily - 7 x weekly - 1-2 sets - 10 reps - 2 seconds  hold - Clamshell with Resistance  - 1 x daily - 7 x weekly - 1-2 sets - 10 reps - 1 seconds  hold - Seated Knee Extension with Anchored Resistance  - 1 x daily - 7 x weekly - 1-2 sets - 10 reps - 2 seconds  hold - Tandem Stance in Corner  - 1 x daily - 7 x weekly - 1-2 sets - 6 reps - 15-30 seconds  hold  ASSESSMENT:  CLINICAL IMPRESSION:  Arrived today doing well, looked a bit more steady when walking. Worked on functional activity tolerance, balance, and strength as per POC today, did well but continues to struggle with coordination. Needed up to multimodal Mod cues for good sequencing and step by step movements for good form. Assigned HEP today as well- will continue to progress as able.    EVAL: Patient is a 83 y.o. F who was seen today for physical therapy evaluation and treatment for I63.9 (ICD-10-CM) - Cerebrovascular accident (CVA), unspecified mechanism (HCC. Very high fall risk, required a lot of education and cues for safety. Daughter present for eval and very supportive, will be doing pool walking and exercise with her. Anticipate with family support that she will improve with skilled PT services.    OBJECTIVE IMPAIRMENTS: Abnormal gait, decreased activity tolerance, decreased balance, decreased cognition, decreased coordination, decreased mobility, difficulty walking, decreased strength, and decreased safety awareness.   ACTIVITY LIMITATIONS: sitting, standing, squatting, transfers, and locomotion level  PARTICIPATION LIMITATIONS: meal prep, cleaning, driving, shopping, community activity, and yard work  PERSONAL FACTORS: Age, Behavior pattern, Education, Fitness, Past/current experiences, and Time since onset of injury/illness/exacerbation are also affecting patient's functional outcome.   REHAB POTENTIAL: Good  CLINICAL DECISION MAKING: Stable/uncomplicated  EVALUATION COMPLEXITY: Low   GOALS: Goals reviewed with  patient? No  SHORT TERM GOALS: Target date: 10/26/2023   Will be compliant with appropriate progressive HEP  Baseline: Goal status: INITIAL  2.  Will be able to name 3 ways to reduce fall risk at home and in the community  Baseline:  Goal status: INITIAL  3.  Concerns regarding L LE burning and strength to have resolved by at least 75% Baseline:  Goal status: INITIAL  4.  Will be compliant with use of appropriate LRAD to reduce fall risk  Baseline:  Goal status: INITIAL    LONG TERM GOALS: Target date: 11/23/2023    MMT to be 5/5 in all tested groups  Baseline:  Goal status: INITIAL  2.  Will score at least 18 on DGI to show reduced fall risk  Baseline:  Goal status: INITIAL  3.  Will have been able to return to walking for exercise with family as desired  Baseline:  Goal status: INITIAL  4.  PSFS to have improved by at least 2 points  Baseline:  Goal status: INITIAL     PLAN:  PT FREQUENCY: 2x/week  PT DURATION: 8 weeks  PLANNED INTERVENTIONS: 97750- Physical Performance Testing, 97110-Therapeutic exercises, 97530- Therapeutic activity, [id number]- Neuromuscular re-education, 97535- Self Care, 02859- Manual therapy, [id number]- Gait training, and [phone]- Aquatic Therapy  PLAN FOR NEXT SESSION: focus on balance and safety with functional mobility, strength and gait. Work on attention and safety awareness in general, slow her down and work on sequencing and coordination   Josette Rough, PT, DPT 10/27/23 11:00 AM

## 2023-11-02 ENCOUNTER — Other Ambulatory Visit: Payer: Self-pay

## 2023-11-02 ENCOUNTER — Emergency Department (HOSPITAL_COMMUNITY): Admission: EM | Admit: 2023-11-02 | Discharge: 2023-11-02 | Disposition: A | Discharge status: Home / Self Care

## 2023-11-02 ENCOUNTER — Emergency Department (HOSPITAL_BASED_OUTPATIENT_CLINIC_OR_DEPARTMENT_OTHER)
Admission: EM | Admit: 2023-11-02 | Discharge: 2023-11-02 | Disposition: A | Discharge status: Home / Self Care | Attending: Emergency Medicine | Admitting: Emergency Medicine

## 2023-11-02 ENCOUNTER — Ambulatory Visit

## 2023-11-02 ENCOUNTER — Encounter (HOSPITAL_COMMUNITY): Payer: Self-pay | Admitting: Emergency Medicine

## 2023-11-02 ENCOUNTER — Emergency Department (HOSPITAL_BASED_OUTPATIENT_CLINIC_OR_DEPARTMENT_OTHER): Admitting: Radiology

## 2023-11-02 DIAGNOSIS — M722 Plantar fascial fibromatosis: Secondary | ICD-10-CM | POA: Diagnosis not present

## 2023-11-02 DIAGNOSIS — M79672 Pain in left foot: Secondary | ICD-10-CM | POA: Diagnosis present

## 2023-11-02 DIAGNOSIS — Z7982 Long term (current) use of aspirin: Secondary | ICD-10-CM | POA: Diagnosis not present

## 2023-11-02 HISTORY — DX: Cerebral infarction, unspecified: I63.9

## 2023-11-02 MED ORDER — IBUPROFEN 400 MG PO TABS
600.0000 mg | ORAL_TABLET | Freq: Once | ORAL | Status: AC
  Administered 2023-11-02: 600 mg via ORAL
  Filled 2023-11-02: qty 1

## 2023-11-02 NOTE — ED Notes (Signed)
 Pt stated the wait is too long here, I a in a lot of pain and am going to leave and go to a different hospital. Pt left emergency department

## 2023-11-02 NOTE — ED Triage Notes (Signed)
 Pt here for left foot pain that started yesterday. Pt states she fell August 3rd.  Pt able to walk. Axox4.

## 2023-11-02 NOTE — Discharge Instructions (Signed)
 You were seen in the [facility] for left foot pain The x-ray looked okay Based on the description of your pain and the exam it looks like plantar fasciitis Please complete the exercises at home for plantar fasciitis as we discussed Take NSAIDs (ibuprofen ) up to every 6 hours as directed for pain relief Follow-up with your physical therapy team and doctor Return to the emergency room for severe pain or if you are unable to walk

## 2023-11-02 NOTE — ED Provider Notes (Signed)
 [place] EMERGENCY DEPARTMENT AT [facility] Provider Note   CSN: [id number] Arrival date & time: 11/02/23  9171     Patient presents with: Foot Pain   Courtney Holmes is a 83 y.o. female.  With a history of stroke and hyperlipidemia presents to the ED for foot pain.  Patient has experienced pain in her left foot over the last 3 days.  No inciting falls or injury prior to the onset of pain.  Pain localized over the plantar aspect of the foot.  Made worse with walking and for step out of bed this morning.  No prior history of similar foot pain or gout.  No fevers chills or other complaint at this time.    Foot Pain       Prior to Admission medications   Medication Sig Start Date End Date Taking? Authorizing Provider  acetaminophen  (TYLENOL ) 500 MG tablet Take 500 mg by mouth 2 (two) times daily as needed for mild pain (pain score 1-3), moderate pain (pain score 4-6) or headache.    [provider]  aspirin  EC 81 MG tablet Take 1 tablet (81 mg total) by mouth daily. Swallow whole. 09/11/23   Patsy Lenis, MD  atorvastatin  (LIPITOR) 20 MG tablet Take 1 tablet (20 mg total) by mouth daily. 09/11/23   Patsy Lenis, MD    Allergies: Patient has no known allergies.    Review of Systems  Updated Vital Signs BP (!) 161/80 (BP Location: Right Arm)   Pulse 60   Temp 98.2 F (36.8 C) (Oral)   Resp 18   SpO2 97%   Physical Exam Vitals and nursing note reviewed.  HENT:     Head: Normocephalic and atraumatic.  Eyes:     Pupils: Pupils are equal, round, and reactive to light.  Cardiovascular:     Rate and Rhythm: Normal rate and regular rhythm.  Pulmonary:     Effort: Pulmonary effort is normal.     Breath sounds: Normal breath sounds.  Abdominal:     Palpations: Abdomen is soft.     Tenderness: There is no abdominal tenderness.  Musculoskeletal:     Comments: No erythema edema or bony tenderness of the left foot or left ankle There is some tenderness along  the plantar fascia Sensation tact light touch throughout   Skin:    General: Skin is warm and dry.  Neurological:     Mental Status: She is alert.  Psychiatric:        Mood and Affect: Mood normal.     (all labs ordered are listed, but only abnormal results are displayed) Labs Reviewed - No data to display  EKG: None  Radiology: DG Foot Complete Left Result Date: 11/02/2023 CLINICAL DATA:  foo tpain. EXAM: LEFT FOOT - COMPLETE 3+ VIEW COMPARISON:  None Available. FINDINGS: No acute fracture or dislocation. No aggressive osseous lesion. Mild diffuse degenerative changes of imaged joints. Calcaneal spur noted along the Achilles tendon and Plantar aponeurosis attachment sites. No focal soft tissue swelling. No radiopaque foreign bodies. IMPRESSION: No acute osseous abnormality of the left foot. Electronically Signed   By: Ree Molt M.D.   On: 11/02/2023 09:13     Procedures   Medications Ordered in the ED  ibuprofen  (ADVIL ) tablet 600 mg (has no administration in time range)  Medical Decision Making 83 year old female presenting for atraumatic foot pain x 3 days.  X-ray shows no osseous abnormalities.  Physical exam most consistent with plantar fasciitis.  Instructed patient and daughter on physical therapy stretching for plantar fasciitis along with NSAIDs.  She has an upcoming physical therapy appointment.  Appropriate for discharge.  Amount and/or Complexity of Data Reviewed Radiology: ordered.        Final diagnoses:  Plantar fasciitis of left foot    ED Discharge Orders     None          Pamella Ozell LABOR, DO 11/02/23 1014

## 2023-11-02 NOTE — ED Triage Notes (Signed)
 Pt. Stated, I fell on aug. 3 and ever since then Ive had left leg pain and my left foot has been burning. My leg really hurts when my leg is hanging down.

## 2023-11-02 NOTE — ED Triage Notes (Signed)
 C/o left foot pain x 3 days. Ambulatory. No swelling or redness. Reports fall at beginning off August. Denies injury from falls.

## 2023-11-03 NOTE — Therapy (Signed)
 OUTPATIENT PHYSICAL THERAPY LOWER EXTREMITY TREATMENT    Patient Name: Courtney Holmes MRN: [id number] DOB:06/17/1940, 83 y.o., female Today's Date: 11/04/2023  END OF SESSION:  PT End of Session - 11/04/23 1753     Visit Number 3    Number of Visits 17    Date for PT Re-Evaluation 11/23/23    Authorization Type [employer] MCR    Authorization Time Period 09/28/23 to 11/23/23    Authorization - Number of Visits 10    PT Start Time 1753    PT Stop Time 1835    PT Time Calculation (min) 42 min    Activity Tolerance Patient tolerated treatment well    Behavior During Therapy Impulsive;WFL for tasks assessed/performed            Past Medical History:  Diagnosis Date   Hypertension    Stroke (cerebrum) [facility])    Past Surgical History:  Procedure Laterality Date   TUBAL LIGATION     Patient Active Problem List   Diagnosis Date Noted   Acute CVA (cerebrovascular accident) (HCC) 09/09/2023   Left shoulder pain 09/09/2023   Knee joint pain 09/09/2023   Chronic pain syndrome 09/09/2023    PCP: Joshua Francisco MD   REFERRING PROVIDER: Patsy Lenis, MD  REFERRING DIAG: I63.9 (ICD-10-CM) - Cerebrovascular accident (CVA), unspecified mechanism (HCC  THERAPY DIAG:  Unsteadiness on feet  Difficulty in walking, not elsewhere classified  Muscle weakness (generalized)  Rationale for Evaluation and Treatment: Rehabilitation  ONSET DATE: CVA found on imaging 09/09/23  SUBJECTIVE:   SUBJECTIVE STATEMENT: I have not been feeling good. My doctor told me I have plantar fasciitis. I am scared I am going to fall. My pain is a 9.5.    EVAL: Still having a lot of fatigue since the stroke. L leg is burning more after the stroke, worse in the mornings but staying about the same. Yesterday was a better day. I was getting the left shoulder looked at before the CVA, it still hurts. Having a hard time sitting for a long period of time due to LE burning. Moving LE helps burning.   PERTINENT  HISTORY: See above  PAIN:  Are you having pain? Yes: NPRS scale: 9.5/10 Pain location: L LE Pain description: burning Aggravating factors: sitting for a long time  Relieving factors: movement   PRECAUTIONS: Fall  RED FLAGS: None   WEIGHT BEARING RESTRICTIONS: No  FALLS:  Has patient fallen in last 6 months? 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  DIAGNOSTIC FINDINGS:   CLINICAL DATA:  144754 Shoulder pain [id number]   EXAM: LEFT SHOULDER - 2+ VIEW   COMPARISON:  October 26, 2014   FINDINGS: Osteopenia.No acute fracture or dislocation. Moderate AC joint space loss, unchanged. Apparent cardiomegaly.   IMPRESSION: 1. Osteopenia.  No acute fracture or dislocation. 2. Moderate osteoarthritis of the AC joint.  CLINICAL DATA:  Knot on the knee, uncontrollable twitching and moving   EXAM: LEFT KNEE - 1-2 VIEW   COMPARISON:  None Available.   FINDINGS: Osteopenia.No acute fracture or dislocation. Trace joint effusion. Moderate tricompartmental joint space loss with osteophyte formation. Quadriceps insertion enthesophyte. Soft tissues are unremarkable.   IMPRESSION: 1. Trace joint effusion.  No acute fracture or dislocation. 2. Moderate tricompartmental osteoarthritis of the knee.  CLINICAL DATA:  Transient ischemic attack (TIA) Left arm / leg weakness - intermittent x 4 days   EXAM: MRI HEAD WITHOUT CONTRAST   TECHNIQUE: Multiplanar,  multiecho pulse sequences of the brain and surrounding structures were obtained without intravenous contrast.   COMPARISON:  CT head 02/28/2023.   FINDINGS: Brain: Small acute or early subacute infarct in the right basal ganglia (series 5, image 31). Mild associated edema without mass effect. No midline shift, acute hemorrhage, mass lesion or hydrocephalus.   Vascular: Major arterial flow voids are maintained at the skull base.   Skull and upper cervical spine: Normal marrow signal.   Sinuses/Orbits: Mild paranasal sinus mucosal thickening. No acute orbital findings.   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  PATIENT SURVEYS:  PSFS: THE PATIENT SPECIFIC FUNCTIONAL SCALE  Place score of 0-10 (0 = unable to perform activity and 10 = able to perform activity at the same level as before injury or problem)  Activity Date: 09/28/23    Sitting too long  5    2. Stamina for regular activities  3    3. Walking for exercise  3    4.      Total Score 3.7      Total Score = Sum of activity scores/number of activities  Minimally Detectable Change: 3 points (for single activity); 2 points (for average score)  [facility] (nd). The Patient Specific Functional Scale . Retrieved from [email or web address]   COGNITION: Overall cognitive status: impulsive, poor safety awareness and insight into deficits      SENSATION: Not tested    LOWER EXTREMITY MMT:  MMT Right eval Left eval  Hip flexion 3 3  Hip extension    Hip abduction    Hip adduction    Hip internal rotation    Hip external rotation    Knee flexion 5 4  Knee extension 5 4  Ankle dorsiflexion 5 5  Ankle plantarflexion    Ankle inversion    Ankle eversion     (Blank rows = not tested)    FUNCTIONAL TESTS:  3 minute walk test: 541ft no device, intermittently unsteady and reaching for walls, intermittent MinA   Dynamic Gait Index:     09/28/23 0001  Standardized Balance Assessment  Standardized Balance Assessment Dynamic Gait Index  Dynamic Gait Index  Level Surface 2  Change in Gait Speed 2  Gait with Horizontal Head Turns 1  Gait with Vertical Head Turns 0  Gait and Pivot Turn 2  Step Over Obstacle 2  Step Around Obstacles 1  Steps 2  Total Score 12       GAIT: Distance walked: 526ft  Assistive device utilized: None Level of assistance: Min A Comments: unsteady in general without device  TREATMENT DATE:  11/04/23 STS x5- needs cues to come down slow and controlled, tends to plop NuStep L3 x61mins  LAQ x10 HS curls red x10 Ball squeeze 2x10 Attempted some STM but very jumpy and has increased pain with just light touch  10/14/23 Per subjective 10/14/23:   [name]  10/08/23 (chair broke and fell straight back), hit head and back, arms shoulders. Urgent care did imaging and found no fractures. Saw neurologist and they told us  to come back to PT to see what she can and can't do. Going back to neurologist on the 14th, this doctor wanted to see what she can and can't do. Needs a lot more help getting dressed, limping a lot.    Attempted nustep on lowest resistance settings but pt was unable to tolerate and did not want to continue with visit.   Assisted pt and daughter in ambulating out to the car, had our front desk cancel all PT appts up to next neurologist appt. Will await neurologist assessment/reccs and if appropriate will try PT again at that time.   No charge for today's visit 09/30/23  Nustep L5x8 minutes all four extremities, seat 10  Bridge + ABD into green TB x10 Sidelying clams green TB x10 B Seated LAQs green TB x10 B  Tandem stance solid surface 2x30 seconds B Side steps in // bars x4 laps solid surface   Alternating toe taps 4 inch box x20 solid surface  Forward step ups onto blue foam pad x10 B Lateral step ups onto blue foam pad x10 B  09/28/23  Eval, POC, HEP and education as below    Nustep L5 x6 minutes BLEs only seat 10  PATIENT EDUCATION:  Education details: exam, POC, HEP, education about fall risk and recovery post CVA, use RW ideally to reduce fall risk  Person educated: Patient and Child(ren) Education method: Explanation, Demonstration, and Handouts Education comprehension: verbalized understanding, returned demonstration, and needs further education  HOME EXERCISE PROGRAM:  Access Code: [email or web address] URL: [email or web address] Date: 09/30/2023 Prepared by: Josette Rough  Exercises - Supine Bridge with Resistance Band  - 1 x daily - 7 x weekly - 1-2 sets - 10 reps - 2 seconds  hold - Clamshell with Resistance  - 1 x daily - 7 x weekly - 1-2 sets - 10 reps - 1 seconds  hold - Seated Knee Extension with Anchored Resistance  - 1 x daily - 7 x weekly - 1-2 sets - 10 reps - 2 seconds  hold - Tandem Stance in Corner  - 1 x daily - 7 x weekly - 1-2 sets - 6 reps - 15-30 seconds  hold  ASSESSMENT:  CLINICAL IMPRESSION:  Pt arrived with high levels of pain and unsteadiness with SPC. She has a low tolerance to activity since she fell and due to pain. Her L side has some decreased motor control and she has difficulty going slow with movements due to issues with coordination. She has increased pain with just light touch to upper back. Pt might benefit from e-stim to help decrease spasms and pain next visit.    EVAL: Patient is a 83 y.o. F who was seen today for physical therapy evaluation and treatment for I63.9 (ICD-10-CM) - Cerebrovascular accident (CVA), unspecified mechanism (HCC. Very high fall risk, required a lot of education and cues for safety. Daughter present for eval and very supportive, will be doing pool walking and exercise with her. Anticipate with family  support that she  will improve with skilled PT services.    OBJECTIVE IMPAIRMENTS: Abnormal gait, decreased activity tolerance, decreased balance, decreased cognition, decreased coordination, decreased mobility, difficulty walking, decreased strength, and decreased safety awareness.   ACTIVITY LIMITATIONS: sitting, standing, squatting, transfers, and locomotion level  PARTICIPATION LIMITATIONS: meal prep, cleaning, driving, shopping, community activity, and yard work  PERSONAL FACTORS: Age, Behavior pattern, Education, Fitness, Past/current experiences, and Time since onset of injury/illness/exacerbation are also affecting patient's functional outcome.   REHAB POTENTIAL: Good  CLINICAL DECISION MAKING: Stable/uncomplicated  EVALUATION COMPLEXITY: Low   GOALS: Goals reviewed with patient? No  SHORT TERM GOALS: Target date: 10/26/2023   Will be compliant with appropriate progressive HEP  Baseline: Goal status: ongoing 11/04/23  2.  Will be able to name 3 ways to reduce fall risk at home and in the community  Baseline:  Goal status: needs more education on fall risk and maybe using something more stable than cane 11/04/23  3.  Concerns regarding L LE burning and strength to have resolved by at least 75% Baseline:  Goal status: no improvement 11/04/23  4.  Will be compliant with use of appropriate LRAD to reduce fall risk  Baseline:  Goal status: ongoing, unsteady with [facility] 11/04/23    LONG TERM GOALS: Target date: 11/23/2023    MMT to be 5/5 in all tested groups  Baseline:  Goal status: INITIAL  2.  Will score at least 18 on DGI to show reduced fall risk  Baseline:  Goal status: INITIAL  3.  Will have been able to return to walking for exercise with family as desired  Baseline:  Goal status: INITIAL  4.  PSFS to have improved by at least 2 points  Baseline:  Goal status: INITIAL     PLAN:  PT FREQUENCY: 2x/week  PT DURATION: 8 weeks  PLANNED INTERVENTIONS:  97750- Physical Performance Testing, 97110-Therapeutic exercises, 97530- Therapeutic activity, [id number]- Neuromuscular re-education, 97535- Self Care, [id number]- Manual therapy, [id number]- Gait training, and [phone]- Aquatic Therapy  PLAN FOR NEXT SESSION: focus on balance and safety with functional mobility, strength and gait. Work on attention and safety awareness in general, slow her down and work on sequencing and coordination. ESTIM, slow movements as tolerated   Almetta Fam, PT, DPT 11/04/23 6:38 PM

## 2023-11-04 ENCOUNTER — Ambulatory Visit

## 2023-11-04 DIAGNOSIS — R2681 Unsteadiness on feet: Secondary | ICD-10-CM

## 2023-11-04 DIAGNOSIS — R262 Difficulty in walking, not elsewhere classified: Secondary | ICD-10-CM | POA: Diagnosis present

## 2023-11-04 DIAGNOSIS — M6281 Muscle weakness (generalized): Secondary | ICD-10-CM

## 2023-11-10 ENCOUNTER — Ambulatory Visit: Payer: Self-pay | Admitting: Podiatry

## 2023-11-10 DIAGNOSIS — M722 Plantar fascial fibromatosis: Secondary | ICD-10-CM

## 2023-11-10 MED ORDER — MELOXICAM 15 MG PO TABS
15.0000 mg | ORAL_TABLET | Freq: Every day | ORAL | 0 refills | Status: AC
Start: 2023-11-10 — End: ?

## 2023-11-10 MED ORDER — METHYLPREDNISOLONE 4 MG PO TBPK: ORAL_TABLET | ORAL | 0 refills | Status: AC

## 2023-11-10 NOTE — Progress Notes (Signed)
  Subjective:  Patient ID: Courtney Holmes, female    DOB: [birth year],  MRN: [id number]  Chief Complaint  Patient presents with   Foot Pain    83 y.o. female presents with the above complaint.  Patient presents with left heel pain ongoing for quite some time and is gotten worse worse with ambulation worse with pressure she is afraid of needles and does not want injection she would like to discuss other alternative treatment options has not seen anyone else prior to seeing me.  Denies any other acute complaints pain scale 7 out of 10 dull aching which are   Review of Systems: Negative except as noted in the HPI. Denies N/V/F/Ch.  Past Medical History:  Diagnosis Date   Hypertension    Stroke (cerebrum) (HCC)     Current Outpatient Medications:    meloxicam  (MOBIC ) 15 MG tablet, Take 1 tablet (15 mg total) by mouth daily., Disp: 30 tablet, Rfl: 0   methylPREDNISolone  (MEDROL  DOSEPAK) 4 MG TBPK tablet, Take as directed, Disp: 21 each, Rfl: 0   acetaminophen  (TYLENOL ) 500 MG tablet, Take 500 mg by mouth 2 (two) times daily as needed for mild pain (pain score 1-3), moderate pain (pain score 4-6) or headache., Disp: , Rfl:    aspirin  EC 81 MG tablet, Take 1 tablet (81 mg total) by mouth daily. Swallow whole., Disp: , Rfl:    atorvastatin  (LIPITOR) 20 MG tablet, Take 1 tablet (20 mg total) by mouth daily., Disp: 90 tablet, Rfl: 2  Social History   Tobacco Use  Smoking Status Never  Smokeless Tobacco Not on file    No Known Allergies Objective:  There were no vitals filed for this visit. There is no height or weight on file to calculate BMI. Constitutional Well developed. Well nourished.  Vascular Dorsalis pedis pulses palpable bilaterally. Posterior tibial pulses palpable bilaterally. Capillary refill normal to all digits.  No cyanosis or clubbing noted. Pedal hair growth normal.  Neurologic Normal speech. Oriented to person, place, and time. Epicritic sensation to light touch  grossly present bilaterally.  Dermatologic Nails well groomed and normal in appearance. No open wounds. No skin lesions.  Orthopedic: Normal joint ROM without pain or crepitus bilaterally. No visible deformities. Tender to palpation at the calcaneal tuber left. No pain with calcaneal squeeze left. Ankle ROM diminished range of motion left. Silfverskiold Test: positive left.   Radiographs: None  Assessment:   1. Plantar fasciitis of left foot    Plan:  Patient was evaluated and treated and all questions answered.  Plantar Fasciitis, left - XR reviewed as above.  - Educated on icing and stretching. Instructions given.  - Injection delivered to the plantar fascia as below. - DME: Plantar fascial brace dispensed to support the medial longitudinal arch of the foot and offload pressure from the heel and prevent arch collapse during weightbearing - Pharmacologic management: None  Procedure: Injection Tendon/Ligament Location: Left plantar fascia at the glabrous junction; medial approach. Skin Prep: alcohol Injectate: 0.5 cc 0.5% marcaine plain, 0.5 cc of 1% Lidocaine, 0.5 cc kenalog 10. Disposition: Patient tolerated procedure well. Injection site dressed with a band-aid.  No follow-ups on file.

## 2023-11-29 ENCOUNTER — Ambulatory Visit: Attending: Internal Medicine | Admitting: Physical Therapy

## 2023-11-29 ENCOUNTER — Encounter: Payer: Self-pay | Admitting: Physical Therapy

## 2023-11-29 DIAGNOSIS — M6281 Muscle weakness (generalized): Secondary | ICD-10-CM | POA: Insufficient documentation

## 2023-11-29 DIAGNOSIS — R2681 Unsteadiness on feet: Secondary | ICD-10-CM | POA: Diagnosis present

## 2023-11-29 DIAGNOSIS — R262 Difficulty in walking, not elsewhere classified: Secondary | ICD-10-CM | POA: Diagnosis present

## 2023-11-29 NOTE — Therapy (Signed)
 OUTPATIENT PHYSICAL THERAPY LOWER EXTREMITY TREATMENT    Patient Name: Courtney Holmes MRN: [id number] DOB:1941-01-23, 83 y.o., female Today's Date: 11/29/2023  END OF SESSION:  PT End of Session - 11/29/23 1702     Visit Number 4    Number of Visits 17    Date for Recertification  12/29/23    Authorization Type [employer] MCR    PT Start Time 1700    PT Stop Time 1745    PT Time Calculation (min) 45 min    Activity Tolerance Patient tolerated treatment well    Behavior During Therapy Impulsive;WFL for tasks assessed/performed            Past Medical History:  Diagnosis Date   Hypertension    Stroke (cerebrum) [facility])    Past Surgical History:  Procedure Laterality Date   TUBAL LIGATION     Patient Active Problem List   Diagnosis Date Noted   Acute CVA (cerebrovascular accident) (HCC) 09/09/2023   Left shoulder pain 09/09/2023   Knee joint pain 09/09/2023   Chronic pain syndrome 09/09/2023    PCP: Joshua Francisco MD   REFERRING PROVIDER: Patsy Lenis, MD  REFERRING DIAG: I63.9 (ICD-10-CM) - Cerebrovascular accident (CVA), unspecified mechanism (HCC  THERAPY DIAG:  Unsteadiness on feet  Difficulty in walking, not elsewhere classified  Muscle weakness (generalized)  Rationale for Evaluation and Treatment: Rehabilitation  ONSET DATE: CVA found on imaging 09/09/23  SUBJECTIVE:   SUBJECTIVE STATEMENT: Patient reports saw podiatrist, feeling a little better with her feet, she c/o neck and back pain 8/10.  Patient really has not been to PT this is her 4 th visit in the past 3 months, she reports a serious complication from a fall in August   EVAL: Still having a lot of fatigue since the stroke. L leg is burning more after the stroke, worse in the mornings but staying about the same. Yesterday was a better day. I was getting the left shoulder looked at before the CVA, it still hurts. Having a hard time sitting for a long period of time due to LE burning. Moving LE  helps burning.   PERTINENT HISTORY: See above  PAIN:  Are you having pain? Yes: NPRS scale: 9.5/10 Pain location: L LE Pain description: burning Aggravating factors: sitting for a long time  Relieving factors: movement   PRECAUTIONS: Fall  RED FLAGS: None   WEIGHT BEARING RESTRICTIONS: No  FALLS:  Has patient fallen in last 6 months? 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  DIAGNOSTIC FINDINGS:   CLINICAL DATA:  144754 Shoulder pain [id number]   EXAM: LEFT SHOULDER - 2+ VIEW   COMPARISON:  October 26, 2014   FINDINGS: Osteopenia.No acute fracture or dislocation. Moderate AC joint space loss, unchanged. Apparent cardiomegaly.   IMPRESSION: 1. Osteopenia.  No acute fracture or dislocation. 2. Moderate osteoarthritis of the AC joint.  CLINICAL DATA:  Knot on the knee, uncontrollable twitching and moving   EXAM: LEFT KNEE - 1-2 VIEW   COMPARISON:  None Available.   FINDINGS: Osteopenia.No acute fracture or dislocation. Trace joint effusion. Moderate tricompartmental joint space loss with osteophyte formation. Quadriceps insertion enthesophyte. Soft tissues are unremarkable.   IMPRESSION: 1. Trace joint effusion.  No acute fracture or dislocation. 2. Moderate tricompartmental osteoarthritis of the knee.  CLINICAL DATA:  Transient ischemic attack (TIA) Left arm / leg weakness - intermittent x 4 days   EXAM: MRI HEAD WITHOUT  CONTRAST   TECHNIQUE: Multiplanar, multiecho pulse sequences of the brain and surrounding structures were obtained without intravenous contrast.   COMPARISON:  CT head 02/28/2023.   FINDINGS: Brain: Small acute or early subacute infarct in the right basal ganglia (series 5, image 31). Mild associated edema without mass effect. No midline shift, acute hemorrhage, mass lesion or hydrocephalus.   Vascular: Major arterial flow voids are maintained at the skull base.   Skull and upper cervical spine: Normal marrow signal.   Sinuses/Orbits: Mild paranasal sinus mucosal thickening. No acute orbital findings.   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  PATIENT SURVEYS:  PSFS: THE PATIENT SPECIFIC FUNCTIONAL SCALE  Place score of 0-10 (0 = unable to perform activity and 10 = able to perform activity at the same level as before injury or problem)  Activity Date: 09/28/23    Sitting too long  5    2. Stamina for regular activities  3    3. Walking for exercise  3    4.      Total Score 3.7      Total Score = Sum of activity scores/number of activities  Minimally Detectable Change: 3 points (for single activity); 2 points (for average score)  [facility] (nd). The Patient Specific Functional Scale . Retrieved from [email or web address]   COGNITION: Overall cognitive status: impulsive, poor safety awareness and insight into deficits      SENSATION: Not tested    LOWER EXTREMITY MMT:  MMT Right eval Left eval  Hip flexion 3 3  Hip extension    Hip abduction    Hip adduction    Hip internal rotation    Hip external rotation    Knee flexion 5 4  Knee extension 5 4  Ankle dorsiflexion 5 5  Ankle plantarflexion    Ankle inversion    Ankle eversion     (Blank rows = not tested)    FUNCTIONAL TESTS:  3 minute walk test: 573ft no device, intermittently unsteady and  reaching for walls, intermittent MinA  Dynamic Gait Index:     09/28/23 0001  Standardized Balance Assessment  Standardized Balance Assessment Dynamic Gait Index  Dynamic Gait Index  Level Surface 2  Change in Gait Speed 2  Gait with Horizontal Head Turns 1  Gait with Vertical Head Turns 0  Gait and Pivot Turn 2  Step Over Obstacle 2  Step Around Obstacles 1  Steps 2  Total Score 12       GAIT: Distance walked: 557ft  Assistive device utilized: None Level of assistance: Min A Comments: unsteady in general without device  TREATMENT DATE:  11/29/23 STM to the upper traps, neck and rhomboids Feet on ball K2C, rotation, small bridge, isometric abs very poor control and tolerance reports a lot of pain Ball b/n knees squeeze Yellow tband clamshells Marches LAQ Yellow tband HS curls Toe taps  11/04/23 STS x5- needs cues to come down slow and controlled, tends to plop NuStep L3 x97mins  LAQ x10 HS curls red x10 Ball squeeze 2x10 Attempted some STM but very jumpy and has increased pain with just light touch  10/14/23 Per subjective 10/14/23:   [name] at [facility]  10/08/23 (chair broke and fell straight back), hit head and back, arms shoulders. Urgent care did imaging and found no fractures. Saw neurologist and they told us  to come back to PT to see what she can and can't do. Going back to neurologist on the 14th, this doctor wanted to see what she can and can't do. Needs a lot more help getting dressed, limping a lot.    Attempted nustep on lowest resistance settings but pt was unable to tolerate and did not want to continue with visit.   Assisted pt and daughter in ambulating out to the car, had our front desk cancel all PT appts up to next neurologist appt. Will await neurologist assessment/reccs and if appropriate will try PT again at  that time.   No charge for today's visit 09/30/23  Nustep L5x8 minutes all four extremities, seat 10  Bridge + ABD into green TB x10 Sidelying clams green TB x10 B Seated LAQs green TB x10 B  Tandem stance solid surface 2x30 seconds B Side steps in // bars x4 laps solid surface  Alternating toe taps 4 inch box x20 solid surface  Forward step ups onto blue foam pad x10 B Lateral step ups onto blue foam pad x10 B  09/28/23  Eval, POC, HEP and education as below    Nustep L5 x6 minutes BLEs only seat 10  PATIENT EDUCATION:  Education details: exam, POC, HEP, education about fall risk and recovery post CVA, use RW ideally to reduce fall risk  Person educated: Patient and Child(ren) Education method: Explanation, Demonstration, and Handouts Education comprehension: verbalized understanding, returned demonstration, and needs further education  HOME EXERCISE PROGRAM:  Access Code: [email or web address] URL: [email or web address] Date: 09/30/2023 Prepared by: Josette Rough  Exercises - Supine Bridge with Resistance Band  - 1 x daily - 7 x weekly - 1-2 sets - 10 reps - 2 seconds  hold - Clamshell with Resistance  - 1 x daily - 7 x weekly - 1-2 sets - 10 reps - 1 seconds  hold - Seated Knee Extension with Anchored Resistance  - 1 x daily - 7 x weekly - 1-2 sets - 10 reps - 2 seconds  hold - Tandem Stance in Corner  - 1 x daily - 7 x weekly - 1-2 sets - 6 reps - 15-30 seconds  hold  ASSESSMENT:  CLINICAL IMPRESSION:  Patient has been seen today is the 4th visit, in the past 90 days, she reports that she had a fall in the first of August and really hurt herself, has pain 8/10 in the back and neck, the last time she was here at pain in the feet a 9.5/10, she rates pain in the neck and the back a 8/10  She has a low tolerance to activity since she fell and due to pain. Has some decreased motor control in bilateral legs and arms had some shaking bilaterally, she  had right leg ataxic  motions and then had where she could not do DF on the left.  and she has difficulty going slow with movements due to issues with coordination. She has increased pain with just light touch to upper back. Pt might benefit from e-stim to help decrease spasms and pain next visit. She has not had any improvements with goals but as noted above has had significant issues with health since the evaluation and really has not been seen much.   EVAL: Patient is a 83 y.o. F who was seen today for physical therapy evaluation and treatment for I63.9 (ICD-10-CM) - Cerebrovascular accident (CVA), unspecified mechanism (HCC. Very high fall risk, required a lot of education and cues for safety. Daughter present for eval and very supportive, will be doing pool walking and exercise with her. Anticipate with family support that she will improve with skilled PT services.    OBJECTIVE IMPAIRMENTS: Abnormal gait, decreased activity tolerance, decreased balance, decreased cognition, decreased coordination, decreased mobility, difficulty walking, decreased strength, and decreased safety awareness.   ACTIVITY LIMITATIONS: sitting, standing, squatting, transfers, and locomotion level  PARTICIPATION LIMITATIONS: meal prep, cleaning, driving, shopping, community activity, and yard work  PERSONAL FACTORS: Age, Behavior pattern, Education, Fitness, Past/current experiences, and Time since onset of injury/illness/exacerbation are also affecting patient's functional outcome.   REHAB POTENTIAL: Good  CLINICAL DECISION MAKING: Stable/uncomplicated  EVALUATION COMPLEXITY: Low   GOALS: Goals reviewed with patient? No  SHORT TERM GOALS: Target date: 10/26/2023   Will be compliant with appropriate progressive HEP  Baseline: Goal status: ongoing 11/29/23  2.  Will be able to name 3 ways to reduce fall risk at home and in the community  Baseline:  Goal status: needs more education on fall risk and maybe using something more  stable than cane 11/29/23  3.  Concerns regarding L LE burning and strength to have resolved by at least 75% Baseline:  Goal status: no improvement 11/29/23  4.  Will be compliant with use of appropriate LRAD to reduce fall risk  Baseline:  Goal status: using a rollator 11/29/23    LONG TERM GOALS: Target date: 11/23/2023    MMT to be 5/5 in all tested groups  Baseline:  Goal status: INITIAL  2.  Will score at least 18 on DGI to show reduced fall risk  Baseline:  Goal status: INITIAL  3.  Will have been able to return to walking for exercise with family as desired  Baseline:  Goal status: INITIAL  4.  PSFS to have improved by at least 2 points  Baseline:  Goal status: INITIAL     PLAN:  PT FREQUENCY: 2x/week  PT DURATION: 8 weeks  PLANNED INTERVENTIONS: 97750- Physical Performance Testing, 97110-Therapeutic exercises, 97530- Therapeutic activity, [id number]- Neuromuscular re-education, 97535- Self Care, [id number]- Manual therapy, [id number]- Gait training, and [phone]- Aquatic Therapy  PLAN FOR NEXT SESSION: focus on balance and safety with functional mobility, strength and gait. Work on attention and safety awareness in general, slow her down and work on sequencing and coordination. ESTIM, slow movements as tolerated   Ozell Mainland, PT  11/29/23 5:52 PM

## 2023-12-02 ENCOUNTER — Ambulatory Visit

## 2023-12-02 ENCOUNTER — Encounter: Payer: Self-pay | Admitting: Physical Therapy

## 2023-12-02 ENCOUNTER — Ambulatory Visit: Admitting: Physical Therapy

## 2023-12-02 DIAGNOSIS — R2681 Unsteadiness on feet: Secondary | ICD-10-CM | POA: Diagnosis not present

## 2023-12-02 DIAGNOSIS — R262 Difficulty in walking, not elsewhere classified: Secondary | ICD-10-CM

## 2023-12-02 DIAGNOSIS — M6281 Muscle weakness (generalized): Secondary | ICD-10-CM

## 2023-12-02 NOTE — Therapy (Signed)
 OUTPATIENT PHYSICAL THERAPY LOWER EXTREMITY TREATMENT    Patient Name: Courtney Holmes MRN: [id number] DOB:[birth year], 83 y.o., female Today's Date: 12/02/2023  END OF SESSION:  PT End of Session - 12/02/23 1331     Visit Number 5    Date for Recertification  12/29/23    PT Start Time 1331    PT Stop Time 1413    PT Time Calculation (min) 42 min    Activity Tolerance Patient tolerated treatment well    Behavior During Therapy Impulsive;WFL for tasks assessed/performed            Past Medical History:  Diagnosis Date   Hypertension    Stroke (cerebrum) [facility])    Past Surgical History:  Procedure Laterality Date   TUBAL LIGATION     Patient Active Problem List   Diagnosis Date Noted   Acute CVA (cerebrovascular accident) (HCC) 09/09/2023   Left shoulder pain 09/09/2023   Knee joint pain 09/09/2023   Chronic pain syndrome 09/09/2023    PCP: Joshua Francisco MD   REFERRING PROVIDER: Patsy Lenis, MD  REFERRING DIAG: I63.9 (ICD-10-CM) - Cerebrovascular accident (CVA), unspecified mechanism (HCC  THERAPY DIAG:  Unsteadiness on feet  Difficulty in walking, not elsewhere classified  Muscle weakness (generalized)  Rationale for Evaluation and Treatment: Rehabilitation  ONSET DATE: CVA found on imaging 09/09/23  SUBJECTIVE:   SUBJECTIVE STATEMENT: I  have been doing pretty good pain I the lower back when sitting   EVAL: Still having a lot of fatigue since the stroke. L leg is burning more after the stroke, worse in the mornings but staying about the same. Yesterday was a better day. I was getting the left shoulder looked at before the CVA, it still hurts. Having a hard time sitting for a long period of time due to LE burning. Moving LE helps burning.   PERTINENT HISTORY: See above  PAIN:  Are you having pain? Yes: NPRS scale: 8/10 Pain location: back Pain description: burning Aggravating factors: sitting for a long time  Relieving factors: movement    PRECAUTIONS: Fall  RED FLAGS: None   WEIGHT BEARING RESTRICTIONS: No  FALLS:  Has patient fallen in last 6 months? 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  DIAGNOSTIC FINDINGS:   CLINICAL DATA:  144754 Shoulder pain 144754   EXAM: LEFT SHOULDER - 2+ VIEW   COMPARISON:  October 26, 2014   FINDINGS: Osteopenia.No acute fracture or dislocation. Moderate AC joint space loss, unchanged. Apparent cardiomegaly.   IMPRESSION: 1. Osteopenia.  No acute fracture or dislocation. 2. Moderate osteoarthritis of the AC joint.  CLINICAL DATA:  Knot on the knee, uncontrollable twitching and moving   EXAM: LEFT KNEE - 1-2 VIEW   COMPARISON:  None Available.   FINDINGS: Osteopenia.No acute fracture or dislocation. Trace joint effusion. Moderate tricompartmental joint space loss with osteophyte formation. Quadriceps insertion enthesophyte. Soft tissues are unremarkable.   IMPRESSION: 1. Trace joint effusion.  No acute fracture or dislocation. 2. Moderate tricompartmental osteoarthritis of the  knee.  CLINICAL DATA:  Transient ischemic attack (TIA) Left arm / leg weakness - intermittent x 4 days   EXAM: MRI HEAD WITHOUT CONTRAST   TECHNIQUE: Multiplanar, multiecho pulse sequences of the brain and surrounding structures were obtained without intravenous contrast.   COMPARISON:  CT head 02/28/2023.   FINDINGS: Brain: Small acute or early subacute  infarct in the right basal ganglia (series 5, image 31). Mild associated edema without mass effect. No midline shift, acute hemorrhage, mass lesion or hydrocephalus.   Vascular: Major arterial flow voids are maintained at the skull base.   Skull and upper cervical spine: Normal marrow signal.   Sinuses/Orbits: Mild paranasal sinus mucosal thickening. No acute orbital findings.   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  PATIENT SURVEYS:  PSFS: THE PATIENT SPECIFIC FUNCTIONAL SCALE  Place score of 0-10 (0 = unable to perform activity and 10 = able to perform activity at the same level as before injury or problem)  Activity Date: 09/28/23    Sitting too long  5    2. Stamina for regular activities  3    3. Walking for exercise  3    4.      Total Score 3.7      Total Score = Sum of activity scores/number of activities  Minimally Detectable Change: 3 points (for single activity); 2 points (for average score)  [facility] (nd). The Patient Specific Functional Scale . Retrieved from [email or web address]   COGNITION: Overall cognitive status: impulsive, poor safety awareness and insight into deficits      SENSATION: Not tested    LOWER EXTREMITY MMT:  MMT Right eval Left eval  Hip flexion 3 3  Hip extension    Hip abduction    Hip adduction    Hip internal rotation    Hip external rotation    Knee flexion 5 4  Knee extension 5 4  Ankle dorsiflexion 5 5  Ankle plantarflexion    Ankle inversion    Ankle eversion     (Blank rows = not tested)    FUNCTIONAL TESTS:  3 minute walk test: 53ft no device, intermittently unsteady and reaching for walls, intermittent MinA  Dynamic Gait Index:     09/28/23 0001  Standardized Balance Assessment  Standardized Balance Assessment Dynamic Gait Index  Dynamic Gait Index  Level Surface 2  Change in Gait Speed 2  Gait  with Horizontal Head Turns 1  Gait with Vertical Head Turns 0  Gait and Pivot Turn 2  Step Over Obstacle 2  Step Around Obstacles 1  Steps 2  Total Score 12       GAIT: Distance walked: 570ft  Assistive device utilized: None Level of assistance: Min A Comments: unsteady in general without device  TREATMENT DATE:  12/02/23 STM to the upper traps, neck and rhomboid Passive stretching to HS and K2C. Difficult time relaxing an allow passive motions [employer] b/n knees squeeze  11/29/23 STM to the upper traps, neck and rhomboids Feet on ball K2C, rotation, small bridge, isometric abs very poor control and tolerance reports a lot of pain Ball b/n knees squeeze Yellow tband clamshells Marches LAQ Yellow tband HS curls Toe taps  11/04/23 STS x5- needs cues to come down slow and controlled, tends to plop NuStep L3 x56mins  LAQ x10 HS curls red x10 Ball squeeze 2x10 Attempted some STM but very jumpy and has increased pain with just light touch  10/14/23 Per subjective 10/14/23:   [name]  10/08/23 (chair broke and fell straight back), hit head and back, arms shoulders. Urgent care did imaging and found no fractures. Saw neurologist and they told us  to come back to PT to see what she can and can't do. Going back to neurologist on the 14th, this doctor wanted to see what she can and can't do. Needs a lot more help getting dressed, limping a lot.    Attempted nustep on lowest resistance settings but pt was unable to tolerate and did not want to continue with visit.   Assisted pt and daughter in ambulating out to the car, had our front desk cancel all PT appts up to next neurologist appt. Will await neurologist assessment/reccs and if appropriate will try PT again at that time.   No charge for today's visit 09/30/23  Nustep  L5x8 minutes all four extremities, seat 10  Bridge + ABD into green TB x10 Sidelying clams green TB x10 B Seated LAQs green TB x10 B  Tandem stance solid surface 2x30 seconds B Side steps in // bars x4 laps solid surface  Alternating toe taps 4 inch box x20 solid surface  Forward step ups onto blue foam pad x10 B Lateral step ups onto blue foam pad x10 B  09/28/23  Eval, POC, HEP and education as below    Nustep L5 x6 minutes BLEs only seat 10  PATIENT EDUCATION:  Education details: exam, POC, HEP, education about fall risk and recovery post CVA, use RW ideally to reduce fall risk  Person educated: Patient and Child(ren) Education method: Explanation, Demonstration, and Handouts Education comprehension: verbalized understanding, returned demonstration, and needs further education  HOME EXERCISE PROGRAM:  Access Code: [email or web address] URL: [email or web address] Date: 09/30/2023 Prepared by: Josette Rough  Exercises - Supine Bridge with Resistance Band  - 1 x daily - 7 x weekly - 1-2 sets - 10 reps - 2 seconds  hold - Clamshell with Resistance  - 1 x daily - 7 x weekly - 1-2 sets - 10 reps - 1 seconds  hold - Seated Knee Extension with Anchored Resistance  - 1 x daily - 7 x weekly - 1-2 sets - 10 reps - 2 seconds  hold - Tandem Stance in Corner  - 1 x daily - 7 x weekly - 1-2 sets - 6 reps - 15-30 seconds  hold  ASSESSMENT:  CLINICAL IMPRESSION:  Patient has been seen today is the 5th visit, in the past 90 days, she reports that she had a fall in the first of August and really hurt herself, has pain 8/10 in the back and neck, the last time she was here at pain in the feet a 9.5/10, she rates pain in the neck and the back a 8/10  She has a low tolerance to activity since she fell and due to pain. Has some decreased motor control in bilateral legs and arms had some shaking bilaterally, she had right leg ataxic motions and then had where she could not do DF on the left.  and  she has difficulty going slow with movements due to issues with coordination.  Increase leg pain with all passive movements.  She has increased pain with just light touch to upper traps and rhomboid area. Pt elected not to try e-stim that was recommended last session. She has not had any improvements with goals but as noted above has had significant issues with health since the evaluation and really has not been seen much.   EVAL: Patient is a 83 y.o. F who was seen today for physical therapy evaluation and treatment for I63.9 (ICD-10-CM) - Cerebrovascular accident (CVA), unspecified mechanism (HCC. Very high fall risk, required a lot of education and cues for safety. Daughter present for eval and very supportive, will be doing pool walking and exercise with her. Anticipate with family support that she will improve with skilled PT services.    OBJECTIVE IMPAIRMENTS: Abnormal gait, decreased activity tolerance, decreased balance, decreased cognition, decreased coordination, decreased mobility, difficulty walking, decreased strength, and decreased safety awareness.   ACTIVITY LIMITATIONS: sitting, standing, squatting, transfers, and locomotion level  PARTICIPATION LIMITATIONS: meal prep, cleaning, driving, shopping, community activity, and yard work  PERSONAL FACTORS: Age, Behavior pattern, Education, Fitness, Past/current experiences, and Time since onset of injury/illness/exacerbation are also affecting patient's functional outcome.   REHAB POTENTIAL: Good  CLINICAL DECISION MAKING: Stable/uncomplicated  EVALUATION COMPLEXITY: Low   GOALS: Goals reviewed with patient? No  SHORT TERM GOALS: Target date: 10/26/2023   Will be compliant with appropriate progressive HEP  Baseline: Goal status: ongoing 11/29/23  2.  Will be able to name 3 ways to reduce fall risk at home and in the community  Baseline:  Goal status: needs more education on fall risk and maybe using something more stable than  cane 11/29/23  3.  Concerns regarding L LE burning and strength to have resolved by at least 75% Baseline:  Goal status: no improvement 11/29/23  4.  Will be compliant with use of appropriate LRAD to reduce fall risk  Baseline:  Goal status: using a rollator 11/29/23    LONG TERM GOALS: Target date: 11/23/2023    MMT to be 5/5 in all tested groups  Baseline:  Goal status: INITIAL  2.  Will score at least 18 on DGI to show reduced fall risk  Baseline:  Goal status: INITIAL  3.  Will have been able to return to walking for exercise with family as desired  Baseline:  Goal status: INITIAL  4.  PSFS to have improved by at least 2 points  Baseline:  Goal status: INITIAL     PLAN:  PT FREQUENCY: 2x/week  PT DURATION: 8 weeks  PLANNED INTERVENTIONS: 97750- Physical Performance Testing, 97110-Therapeutic exercises, 97530- Therapeutic activity, [id number]- Neuromuscular re-education, 97535- Self Care, [id number]- Manual therapy, [id number]- Gait training, and [phone]- Aquatic Therapy  PLAN FOR NEXT SESSION: focus on balance and safety with functional mobility, strength and gait. Work on attention and safety awareness in general, slow her down and work on sequencing and coordination. ESTIM, slow movements as tolerated   Tanda Sorrow, PTA 12/02/23 1:31 PM

## 2023-12-06 ENCOUNTER — Ambulatory Visit

## 2023-12-06 DIAGNOSIS — R2681 Unsteadiness on feet: Secondary | ICD-10-CM

## 2023-12-06 DIAGNOSIS — R262 Difficulty in walking, not elsewhere classified: Secondary | ICD-10-CM

## 2023-12-06 DIAGNOSIS — M6281 Muscle weakness (generalized): Secondary | ICD-10-CM

## 2023-12-06 NOTE — Therapy (Signed)
 OUTPATIENT PHYSICAL THERAPY LOWER EXTREMITY TREATMENT    Patient Name: Courtney Holmes MRN: [id number] DOB:07-10-1940, 83 y.o., female Today's Date: 12/06/2023  END OF SESSION:  PT End of Session - 12/06/23 1656     Visit Number 6    Date for Recertification  12/29/23    PT Start Time 1700    PT Stop Time 1740    PT Time Calculation (min) 40 min    Activity Tolerance Patient tolerated treatment well    Behavior During Therapy Impulsive;WFL for tasks assessed/performed             Past Medical History:  Diagnosis Date   Hypertension    Stroke (cerebrum) [facility])    Past Surgical History:  Procedure Laterality Date   TUBAL LIGATION     Patient Active Problem List   Diagnosis Date Noted   Acute CVA (cerebrovascular accident) (HCC) 09/09/2023   Left shoulder pain 09/09/2023   Knee joint pain 09/09/2023   Chronic pain syndrome 09/09/2023    PCP: Joshua Francisco MD   REFERRING PROVIDER: Patsy Lenis, MD  REFERRING DIAG: I63.9 (ICD-10-CM) - Cerebrovascular accident (CVA), unspecified mechanism (HCC  THERAPY DIAG:  Unsteadiness on feet  Difficulty in walking, not elsewhere classified  Muscle weakness (generalized)  Rationale for Evaluation and Treatment: Rehabilitation  ONSET DATE: CVA found on imaging 09/09/23  SUBJECTIVE:   SUBJECTIVE STATEMENT: Feeling a little better today, trying to do some walking with the cane in the house. Doing some exercises laying down that you gave me. The pain is still there, in the back and legs. A lot of burning in the legs.    EVAL: Still having a lot of fatigue since the stroke. L leg is burning more after the stroke, worse in the mornings but staying about the same. Yesterday was a better day. I was getting the left shoulder looked at before the CVA, it still hurts. Having a hard time sitting for a long period of time due to LE burning. Moving LE helps burning.   PERTINENT HISTORY: See above  PAIN:  Are you having pain? Yes:  NPRS scale: 8/10 Pain location: back Pain description: burning Aggravating factors: sitting for a long time  Relieving factors: movement   PRECAUTIONS: Fall  RED FLAGS: None   WEIGHT BEARING RESTRICTIONS: No  FALLS:  Has patient fallen in last 6 months? 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  DIAGNOSTIC FINDINGS:   CLINICAL DATA:  [id number] Shoulder pain [id number]   EXAM: LEFT SHOULDER - 2+ VIEW   COMPARISON:  October 26, 2014   FINDINGS: Osteopenia.No acute fracture or dislocation. Moderate AC joint space loss, unchanged. Apparent cardiomegaly.   IMPRESSION: 1. Osteopenia.  No acute fracture or dislocation. 2. Moderate osteoarthritis of the AC joint.  CLINICAL DATA:  Knot on the knee, uncontrollable twitching and moving   EXAM: LEFT KNEE - 1-2 VIEW   COMPARISON:  None Available.   FINDINGS: Osteopenia.No acute fracture or dislocation. Trace joint effusion. Moderate tricompartmental joint space  loss with osteophyte formation. Quadriceps insertion enthesophyte. Soft tissues are unremarkable.   IMPRESSION: 1. Trace joint effusion.  No acute fracture or dislocation. 2. Moderate tricompartmental osteoarthritis of the knee.  CLINICAL DATA:  Transient ischemic attack (TIA) Left arm / leg weakness - intermittent x 4 days   EXAM: MRI HEAD WITHOUT CONTRAST   TECHNIQUE: Multiplanar, multiecho pulse sequences of the brain and  surrounding structures were obtained without intravenous contrast.   COMPARISON:  CT head 02/28/2023.   FINDINGS: Brain: Small acute or early subacute infarct in the right basal ganglia (series 5, image 31). Mild associated edema without mass effect. No midline shift, acute hemorrhage, mass lesion or hydrocephalus.   Vascular: Major arterial flow voids are maintained at the skull base.   Skull and upper cervical spine: Normal marrow signal.   Sinuses/Orbits: Mild paranasal sinus mucosal thickening. No acute orbital findings.   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  PATIENT SURVEYS:  PSFS: THE PATIENT SPECIFIC FUNCTIONAL SCALE  Place score of 0-10 (0 = unable to perform activity and 10 = able to perform activity at the same level as before injury or problem)  Activity Date: 09/28/23 12/06/23   Sitting too long  5 5   2. Stamina for regular activities  3 4   3. Walking for exercise  3 4   4.      Total Score 3.7 4.3     Total Score = Sum of activity scores/number of activities  Minimally Detectable Change: 3 points (for single activity); 2 points (for average score)  [facility] (nd). The Patient Specific Functional Scale . Retrieved from [email or web address]   COGNITION: Overall cognitive status: impulsive, poor safety awareness and insight into deficits      SENSATION: Not tested    LOWER EXTREMITY MMT:  MMT Right eval Left eval  Hip flexion 3 3  Hip extension    Hip abduction    Hip adduction    Hip internal rotation    Hip external rotation    Knee flexion 5 4  Knee extension 5 4  Ankle dorsiflexion 5 5  Ankle plantarflexion    Ankle inversion    Ankle eversion     (Blank rows = not tested)    FUNCTIONAL TESTS:  3 minute walk test: 54ft no device, intermittently unsteady and reaching for walls, intermittent MinA  Dynamic Gait Index:      09/28/23 0001  Standardized Balance Assessment  Standardized Balance Assessment Dynamic Gait Index  Dynamic Gait Index  Level Surface 2  Change in Gait Speed 2  Gait with Horizontal Head Turns 1  Gait with Vertical Head Turns 0  Gait and Pivot Turn 2  Step Over Obstacle 2  Step Around Obstacles 1  Steps 2  Total Score 12       GAIT: Distance walked: 543ft  Assistive device utilized: None Level of assistance: Min A Comments: unsteady in general without device  TREATMENT DATE:  12/06/23 NuStep L2 x5mins  Seated stretch with pball x10 Seated clamshell red 2x10 Ball squeeze 2x10 LAQ 2x10   12/02/23 STM to the upper traps, neck and rhomboid Passive stretching to HS and K2C. Difficult time relaxing an allow passive motions [employer] b/n knees squeeze  11/29/23 STM to the upper traps, neck and rhomboids Feet on ball K2C, rotation, small bridge, isometric abs very poor control and tolerance reports a lot of pain Ball b/n knees squeeze Yellow tband clamshells Marches LAQ Yellow tband HS curls Toe taps  11/04/23 STS x5- needs cues to come down slow and controlled, tends to plop NuStep L3 x4mins  LAQ x10 HS curls red x10 Ball squeeze 2x10 Attempted some STM but very jumpy and has increased pain with just light touch  10/14/23 Per subjective 10/14/23:   [name] at [facility]  10/08/23 (chair broke and fell straight back), hit head and back, arms shoulders. Urgent care did imaging and found no fractures. Saw neurologist and they told us  to come back to PT to see what she can and can't do. Going back to neurologist on the 14th, this doctor wanted to see what she can and can't do. Needs a lot more help getting dressed, limping a lot.    Attempted nustep on lowest resistance settings but pt was unable to tolerate and did not want to  continue with visit.   Assisted pt and daughter in ambulating out to the car, had our front desk cancel all PT appts up to next neurologist appt. Will await neurologist assessment/reccs and if appropriate will try PT again at that time.   No charge for today's visit 09/30/23  Nustep L5x8 minutes all four extremities, seat 10  Bridge + ABD into green TB x10 Sidelying clams green TB x10 B Seated LAQs green TB x10 B  Tandem stance solid surface 2x30 seconds B Side steps in // bars x4 laps solid surface  Alternating toe taps 4 inch box x20 solid surface  Forward step ups onto blue foam pad x10 B Lateral step ups onto blue foam pad x10 B  09/28/23  Eval, POC, HEP and education as below    Nustep L5 x6 minutes BLEs only seat 10  PATIENT EDUCATION:  Education details: exam, POC, HEP, education about fall risk and recovery post CVA, use RW ideally to reduce fall risk  Person educated: Patient and Child(ren) Education method: Explanation, Demonstration, and Handouts Education comprehension: verbalized understanding, returned demonstration, and needs further education  HOME EXERCISE PROGRAM:  Access Code: [email or web address] URL: [email or web address] Date: 09/30/2023 Prepared by: Josette Rough  Exercises - Supine Bridge with Resistance Band  - 1 x daily - 7 x weekly - 1-2 sets - 10 reps - 2 seconds  hold - Clamshell with Resistance  - 1 x daily - 7 x weekly - 1-2 sets - 10 reps - 1 seconds  hold - Seated Knee Extension with Anchored Resistance  - 1 x daily - 7 x weekly - 1-2 sets - 10 reps - 2 seconds  hold - Tandem Stance in Corner  - 1 x daily - 7 x weekly - 1-2 sets - 6 reps - 15-30 seconds  hold  ASSESSMENT:  CLINICAL IMPRESSION:  Patient reports feeling some better today, but still has high levels of pain. She still has a very low tolerance to activity since she fell and due to pain. Has some decreased motor control in bilateral legs and arms had some shaking  bilaterally. She has some difficulty going slow with movements due to issues with coordination. Was advised to keep practicing exercises at home in order to rebuild neural pathways and help with decreased tremors.    EVAL: Patient is a 83 y.o. F who was seen today for physical therapy evaluation and treatment for I63.9 (ICD-10-CM) - Cerebrovascular accident (CVA), unspecified mechanism (HCC. Very high fall risk, required a lot of education and cues for safety. Daughter present for eval and very supportive, will be doing pool walking and exercise with her. Anticipate with family support that she will improve with skilled PT services.    OBJECTIVE IMPAIRMENTS: Abnormal gait, decreased activity tolerance, decreased balance, decreased cognition, decreased coordination, decreased mobility, difficulty walking, decreased strength, and decreased safety awareness.   ACTIVITY LIMITATIONS: sitting, standing, squatting, transfers, and locomotion level  PARTICIPATION LIMITATIONS: meal prep, cleaning, driving, shopping, community activity, and yard work  PERSONAL FACTORS: Age, Behavior pattern, Education, Fitness, Past/current experiences, and Time since onset of injury/illness/exacerbation are also affecting patient's functional outcome.   REHAB POTENTIAL: Good  CLINICAL DECISION MAKING: Stable/uncomplicated  EVALUATION COMPLEXITY: Low   GOALS: Goals reviewed with patient? No  SHORT TERM GOALS: Target date: 10/26/2023   Will be compliant with appropriate progressive HEP  Baseline: Goal status: ongoing 11/29/23  2.  Will be able to name 3 ways to reduce fall risk at home and in the community  Baseline:  Goal status: needs more education on fall risk and maybe using something more stable than cane 11/29/23  3.  Concerns regarding L LE burning and strength to have resolved by at least 75% Baseline:  Goal status: no improvement 11/29/23  4.  Will be compliant with use of appropriate LRAD to reduce  fall risk  Baseline:  Goal status: using a rollator 11/29/23    LONG TERM GOALS: Target date: 12/29/23    MMT to be 5/5 in all tested groups  Baseline:  Goal status: INITIAL  2.  Will score at least 18 on DGI to show reduced fall risk  Baseline:  Goal status: INITIAL  3.  Will have been able to return to walking for exercise with family as desired  Baseline:  Goal status: progressing 12/06/23  4.  PSFS to have improved by at least 2 points  Baseline:  Goal status: progressing 12/06/23     PLAN:  PT FREQUENCY: 2x/week  PT DURATION: 8 weeks  PLANNED INTERVENTIONS: 97750- Physical Performance Testing, 97110-Therapeutic exercises, 97530- Therapeutic activity, [id number]- Neuromuscular re-education, 97535- Self Care, [id number]- Manual therapy, [id number]- Gait training, and [phone]- Aquatic Therapy  PLAN FOR NEXT SESSION: focus on balance and safety with functional mobility, strength and gait. Work on attention and safety awareness in general, slow her down and work on sequencing and coordination. ESTIM, slow movements as tolerated   Almetta Fam, PT, DPT 12/06/23 5:38 PM

## 2023-12-08 ENCOUNTER — Ambulatory Visit: Admitting: Podiatry

## 2023-12-09 ENCOUNTER — Ambulatory Visit

## 2023-12-13 ENCOUNTER — Ambulatory Visit: Attending: Internal Medicine | Admitting: Physical Therapy

## 2023-12-13 ENCOUNTER — Encounter: Payer: Self-pay | Admitting: Physical Therapy

## 2023-12-13 DIAGNOSIS — M6281 Muscle weakness (generalized): Secondary | ICD-10-CM | POA: Diagnosis present

## 2023-12-13 DIAGNOSIS — R262 Difficulty in walking, not elsewhere classified: Secondary | ICD-10-CM | POA: Insufficient documentation

## 2023-12-13 DIAGNOSIS — R2681 Unsteadiness on feet: Secondary | ICD-10-CM | POA: Insufficient documentation

## 2023-12-13 NOTE — Therapy (Signed)
 OUTPATIENT PHYSICAL THERAPY LOWER EXTREMITY TREATMENT    Patient Name: Courtney Holmes MRN: [id number] DOB:August 18, 1940, 83 y.o., female Today's Date: 12/13/2023  END OF SESSION:  PT End of Session - 12/13/23 1737     Visit Number 7    Number of Visits 17    Date for Recertification  12/29/23    Authorization Type [employer] MCR    PT Start Time 1738    PT Stop Time 1826    PT Time Calculation (min) 48 min    Activity Tolerance Patient tolerated treatment well    Behavior During Therapy Impulsive;WFL for tasks assessed/performed             Past Medical History:  Diagnosis Date   Hypertension    Stroke (cerebrum) [facility])    Past Surgical History:  Procedure Laterality Date   TUBAL LIGATION     Patient Active Problem List   Diagnosis Date Noted   Acute CVA (cerebrovascular accident) (HCC) 09/09/2023   Left shoulder pain 09/09/2023   Knee joint pain 09/09/2023   Chronic pain syndrome 09/09/2023    PCP: Joshua Francisco MD   REFERRING PROVIDER: Patsy Lenis, MD  REFERRING DIAG: I63.9 (ICD-10-CM) - Cerebrovascular accident (CVA), unspecified mechanism (HCC  THERAPY DIAG:  Unsteadiness on feet  Difficulty in walking, not elsewhere classified  Muscle weakness (generalized)  Rationale for Evaluation and Treatment: Rehabilitation  ONSET DATE: CVA found on imaging 09/09/23  SUBJECTIVE:   SUBJECTIVE STATEMENT: I am doing better, just afraid of falling, my back still does hurt a lot,  I did go to church yesterday for the first time  I was very tired and sore  EVAL: Still having a lot of fatigue since the stroke. L leg is burning more after the stroke, worse in the mornings but staying about the same. Yesterday was a better day. I was getting the left shoulder looked at before the CVA, it still hurts. Having a hard time sitting for a long period of time due to LE burning. Moving LE helps burning.   PERTINENT HISTORY: See above  PAIN:  Are you having pain? Yes: NPRS  scale: 8/10 Pain location: back Pain description: burning Aggravating factors: sitting for a long time  Relieving factors: movement   PRECAUTIONS: Fall  RED FLAGS: None   WEIGHT BEARING RESTRICTIONS: No  FALLS:  Has patient fallen in last 6 months? 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  DIAGNOSTIC FINDINGS:   CLINICAL DATA:  144754 Shoulder pain [id number]   EXAM: LEFT SHOULDER - 2+ VIEW   COMPARISON:  October 26, 2014   FINDINGS: Osteopenia.No acute fracture or dislocation. Moderate AC joint space loss, unchanged. Apparent cardiomegaly.   IMPRESSION: 1. Osteopenia.  No acute fracture or dislocation. 2. Moderate osteoarthritis of the AC joint.  CLINICAL DATA:  Knot on the knee, uncontrollable twitching and moving   EXAM: LEFT KNEE - 1-2 VIEW   COMPARISON:  None Available.   FINDINGS: Osteopenia.No acute fracture or dislocation. Trace joint effusion. Moderate  tricompartmental joint space loss with osteophyte formation. Quadriceps insertion enthesophyte. Soft tissues are unremarkable.   IMPRESSION: 1. Trace joint effusion.  No acute fracture or dislocation. 2. Moderate tricompartmental osteoarthritis of the knee.  CLINICAL DATA:  Transient ischemic attack (TIA) Left arm / leg weakness - intermittent x 4 days   EXAM: MRI HEAD WITHOUT CONTRAST   TECHNIQUE: Multiplanar, multiecho pulse sequences of the brain and  surrounding structures were obtained without intravenous contrast.   COMPARISON:  CT head 02/28/2023.   FINDINGS: Brain: Small acute or early subacute infarct in the right basal ganglia (series 5, image 31). Mild associated edema without mass effect. No midline shift, acute hemorrhage, mass lesion or hydrocephalus.   Vascular: Major arterial flow voids are maintained at the skull base.   Skull and upper cervical spine: Normal marrow signal.   Sinuses/Orbits: Mild paranasal sinus mucosal thickening. No acute orbital findings.   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  PATIENT SURVEYS:  PSFS: THE PATIENT SPECIFIC FUNCTIONAL SCALE  Place score of 0-10 (0 = unable to perform activity and 10 = able to perform activity at the same level as before injury or problem)  Activity Date: 09/28/23 12/06/23   Sitting too long  5 5   2. Stamina for regular activities  3 4   3. Walking for exercise  3 4   4.      Total Score 3.7 4.3     Total Score = Sum of activity scores/number of activities  Minimally Detectable Change: 3 points (for single activity); 2 points (for average score)  [facility] (nd). The Patient Specific Functional Scale . Retrieved from [email or web address]   COGNITION: Overall cognitive status: impulsive, poor safety awareness and insight into deficits      SENSATION: Not tested    LOWER EXTREMITY MMT:  MMT Right eval Left eval  Hip flexion 3 3  Hip extension    Hip abduction    Hip adduction    Hip internal rotation    Hip external rotation    Knee flexion 5 4  Knee extension 5 4  Ankle dorsiflexion 5 5  Ankle plantarflexion    Ankle inversion    Ankle eversion     (Blank rows = not tested)    FUNCTIONAL TESTS:  3 minute walk test: 56ft no device, intermittently unsteady and reaching for walls, intermittent MinA  Dynamic Gait Index:      09/28/23 0001  Standardized Balance Assessment  Standardized Balance Assessment Dynamic Gait Index  Dynamic Gait Index  Level Surface 2  Change in Gait Speed 2  Gait with Horizontal Head Turns 1  Gait with Vertical Head Turns 0  Gait and Pivot Turn 2  Step Over Obstacle 2  Step Around Obstacles 1  Steps 2  Total Score 12       GAIT: Distance walked: 557ft  Assistive device utilized: None Level of assistance: Min A Comments: unsteady in general without device  TREATMENT DATE:  12/13/23 Nustep level 3 x 5 minutes Seated pball stretch Standing with walker marches, hip abduction CGA STM to the upper traps dn the upper buttocks 15# HS curls LAQ no weight Seated red tband row 5# standing straight arm pulls but very uncoordinated requiring CGA Red tband hip adduction and abduction single legs REd tband ankle exercises poor control Foot on dyna disc heel to toe, poor control HHA side stepping, fwd and backward walking  12/06/23 NuStep L2 x39mins  Seated stretch with pball x10 Seated clamshell red 2x10 Ball squeeze 2x10 LAQ 2x10   12/02/23 STM to the upper traps, neck and rhomboid Passive stretching to HS and K2C. Difficult time relaxing an allow passive motions [employer] b/n knees squeeze  11/29/23 STM to the upper traps, neck and rhomboids Feet on ball K2C, rotation, small bridge, isometric abs very poor control and tolerance reports a lot of pain Ball b/n knees squeeze Yellow tband clamshells Marches LAQ Yellow tband HS curls Toe taps  11/04/23 STS x5- needs cues to come down slow and controlled, tends to plop NuStep L3 x74mins  LAQ x10 HS curls red x10 Ball squeeze 2x10 Attempted some STM but very jumpy and has increased pain with just light touch  10/14/23 Per subjective 10/14/23:   [name] at [facility]   10/08/23 (chair broke and fell straight back), hit head and back, arms shoulders. Urgent care did imaging and found no fractures. Saw neurologist and they told us  to come back to PT to see what she can and can't do. Going back to neurologist on the 14th, this doctor wanted to see what she can and can't do. Needs a lot more help getting dressed, limping a lot.    Attempted nustep on lowest resistance settings but pt was unable to tolerate and did not want to continue with visit.   Assisted pt and daughter in ambulating out to the car, had our front desk cancel all PT appts up to next neurologist appt. Will await neurologist assessment/reccs and if appropriate will try PT again at that time.   No charge for today's visit 09/30/23  Nustep L5x8 minutes all four extremities, seat 10  Bridge + ABD into green TB x10 Sidelying clams green TB x10 B Seated LAQs green TB x10 B  Tandem stance solid surface 2x30 seconds B Side steps in // bars x4 laps solid surface  Alternating toe taps 4 inch box x20 solid surface  Forward step ups onto blue foam pad x10 B Lateral step ups onto blue foam pad x10 B  09/28/23  Eval, POC, HEP and education as below    Nustep L5 x6 minutes BLEs only seat 10  PATIENT EDUCATION:  Education details: exam, POC, HEP, education about fall risk and recovery post CVA, use RW ideally to reduce fall risk  Person educated: Patient and Child(ren) Education method: Explanation, Demonstration, and Handouts Education comprehension: verbalized understanding, returned demonstration, and needs further education  HOME EXERCISE PROGRAM:  Access Code: [email or web address] URL: [email or web address] Date: 09/30/2023 Prepared by: Josette Rough  Exercises - Supine Bridge with Resistance Band  - 1 x daily - 7 x weekly - 1-2 sets - 10 reps - 2 seconds  hold - Clamshell with Resistance  - 1 x daily - 7 x weekly - 1-2 sets - 10 reps - 1 seconds  hold - Seated Knee Extension with  Anchored Resistance  - 1 x daily - 7 x weekly - 1-2 sets - 10 reps -  2 seconds  hold - Tandem Stance in Corner  - 1 x daily - 7 x weekly - 1-2 sets - 6 reps - 15-30 seconds  hold  ASSESSMENT:  CLINICAL IMPRESSION:  Patient was able to go to church yesterday, walking with a SPC, with exercises she really has poor control and coordination of the LE's, has a lot of extraneous motions in the hips, knees and ankles, if we do both legs at the same time it seems to be worse, I did some exercise singularly, it helps some but still very poor control, a lot of jerky motions.   EVAL: Patient is a 83 y.o. F who was seen today for physical therapy evaluation and treatment for I63.9 (ICD-10-CM) - Cerebrovascular accident (CVA), unspecified mechanism (HCC. Very high fall risk, required a lot of education and cues for safety. Daughter present for eval and very supportive, will be doing pool walking and exercise with her. Anticipate with family support that she will improve with skilled PT services.    OBJECTIVE IMPAIRMENTS: Abnormal gait, decreased activity tolerance, decreased balance, decreased cognition, decreased coordination, decreased mobility, difficulty walking, decreased strength, and decreased safety awareness.   ACTIVITY LIMITATIONS: sitting, standing, squatting, transfers, and locomotion level  PARTICIPATION LIMITATIONS: meal prep, cleaning, driving, shopping, community activity, and yard work  PERSONAL FACTORS: Age, Behavior pattern, Education, Fitness, Past/current experiences, and Time since onset of injury/illness/exacerbation are also affecting patient's functional outcome.   REHAB POTENTIAL: Good  CLINICAL DECISION MAKING: Stable/uncomplicated  EVALUATION COMPLEXITY: Low   GOALS: Goals reviewed with patient? No  SHORT TERM GOALS: Target date: 10/26/2023   Will be compliant with appropriate progressive HEP  Baseline: Goal status: met 12/13/23  2.  Will be able to name 3 ways to  reduce fall risk at home and in the community  Baseline:  Goal status: needs more education on fall risk and maybe using something more stable than cane 11/29/23  3.  Concerns regarding L LE burning and strength to have resolved by at least 75% Baseline:  Goal status: no improvement 11/29/23  4.  Will be compliant with use of appropriate LRAD to reduce fall risk  Baseline:  Goal status: met 12/13/23    LONG TERM GOALS: Target date: 12/29/23    MMT to be 5/5 in all tested groups  Baseline:  Goal status: INITIAL  2.  Will score at least 18 on DGI to show reduced fall risk  Baseline:  Goal status: INITIAL  3.  Will have been able to return to walking for exercise with family as desired  Baseline:  Goal status: progressing 12/06/23  4.  PSFS to have improved by at least 2 points  Baseline:  Goal status: progressing 12/06/23     PLAN:  PT FREQUENCY: 2x/week  PT DURATION: 8 weeks  PLANNED INTERVENTIONS: 97750- Physical Performance Testing, 97110-Therapeutic exercises, 97530- Therapeutic activity, [id number]- Neuromuscular re-education, 97535- Self Care, [id number]- Manual therapy, [id number]- Gait training, and [phone]- Aquatic Therapy  PLAN FOR NEXT SESSION: focus on balance and safety with functional mobility, strength and gait. Work on attention and safety awareness in general, slow her down and work on sequencing and coordination. ESTIM, slow movements as tolerated   Ozell Mainland, PT 12/13/23 5:38 PM

## 2023-12-16 ENCOUNTER — Ambulatory Visit

## 2023-12-16 DIAGNOSIS — M6281 Muscle weakness (generalized): Secondary | ICD-10-CM

## 2023-12-16 DIAGNOSIS — R2681 Unsteadiness on feet: Secondary | ICD-10-CM

## 2023-12-16 DIAGNOSIS — R262 Difficulty in walking, not elsewhere classified: Secondary | ICD-10-CM

## 2023-12-16 NOTE — Therapy (Signed)
 OUTPATIENT PHYSICAL THERAPY LOWER EXTREMITY TREATMENT    Patient Name: Courtney Holmes MRN: [id number] DOB:1940-11-04, 83 y.o., female Today's Date: 12/16/2023  END OF SESSION:  PT End of Session - 12/16/23 1752     Visit Number 8    Number of Visits --    Date for Recertification  12/29/23    Authorization Type [employer] MCR    PT Start Time 1755    PT Stop Time 1840    PT Time Calculation (min) 45 min    Activity Tolerance Patient tolerated treatment well    Behavior During Therapy Impulsive;WFL for tasks assessed/performed              Past Medical History:  Diagnosis Date   Hypertension    Stroke (cerebrum) [facility])    Past Surgical History:  Procedure Laterality Date   TUBAL LIGATION     Patient Active Problem List   Diagnosis Date Noted   Acute CVA (cerebrovascular accident) (HCC) 09/09/2023   Left shoulder pain 09/09/2023   Knee joint pain 09/09/2023   Chronic pain syndrome 09/09/2023    PCP: Joshua Francisco MD   REFERRING PROVIDER: Patsy Lenis, MD  REFERRING DIAG: I63.9 (ICD-10-CM) - Cerebrovascular accident (CVA), unspecified mechanism (HCC  THERAPY DIAG:  Unsteadiness on feet  Difficulty in walking, not elsewhere classified  Muscle weakness (generalized)  Rationale for Evaluation and Treatment: Rehabilitation  ONSET DATE: CVA found on imaging 09/09/23  SUBJECTIVE:   SUBJECTIVE STATEMENT: I feel a little bit better than I have been when I first started coming. Doing my exercises at home. I am still in a lot of pain but it is not as bad.   EVAL: Still having a lot of fatigue since the stroke. L leg is burning more after the stroke, worse in the mornings but staying about the same. Yesterday was a better day. I was getting the left shoulder looked at before the CVA, it still hurts. Having a hard time sitting for a long period of time due to LE burning. Moving LE helps burning.   PERTINENT HISTORY: See above  PAIN:  Are you having pain? Yes: NPRS  scale: 8/10 Pain location: back Pain description: burning Aggravating factors: sitting for a long time  Relieving factors: movement   PRECAUTIONS: Fall  RED FLAGS: None   WEIGHT BEARING RESTRICTIONS: No  FALLS:  Has patient fallen in last 6 months? 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  DIAGNOSTIC FINDINGS:   CLINICAL DATA:  144754 Shoulder pain 144754   EXAM: LEFT SHOULDER - 2+ VIEW   COMPARISON:  October 26, 2014   FINDINGS: Osteopenia.No acute fracture or dislocation. Moderate AC joint space loss, unchanged. Apparent cardiomegaly.   IMPRESSION: 1. Osteopenia.  No acute fracture or dislocation. 2. Moderate osteoarthritis of the AC joint.  CLINICAL DATA:  Knot on the knee, uncontrollable twitching and moving   EXAM: LEFT KNEE - 1-2 VIEW   COMPARISON:  None Available.   FINDINGS: Osteopenia.No acute fracture or dislocation. Trace  joint effusion. Moderate tricompartmental joint space loss with osteophyte formation. Quadriceps insertion enthesophyte. Soft tissues are unremarkable.   IMPRESSION: 1. Trace joint effusion.  No acute fracture or dislocation. 2. Moderate tricompartmental osteoarthritis of the knee.  CLINICAL DATA:  Transient ischemic attack (TIA) Left arm / leg weakness - intermittent x 4 days   EXAM: MRI HEAD WITHOUT CONTRAST   TECHNIQUE: Multiplanar, multiecho pulse sequences of the brain and  surrounding structures were obtained without intravenous contrast.   COMPARISON:  CT head 02/28/2023.   FINDINGS: Brain: Small acute or early subacute infarct in the right basal ganglia (series 5, image 31). Mild associated edema without mass effect. No midline shift, acute hemorrhage, mass lesion or hydrocephalus.   Vascular: Major arterial flow voids are maintained at the skull base.   Skull and upper cervical spine: Normal marrow signal.   Sinuses/Orbits: Mild paranasal sinus mucosal thickening. No acute orbital findings.   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  PATIENT SURVEYS:  PSFS: THE PATIENT SPECIFIC FUNCTIONAL SCALE  Place score of 0-10 (0 = unable to perform activity and 10 = able to perform activity at the same level as before injury or problem)  Activity Date: 09/28/23 12/06/23   Sitting too long  5 5   2. Stamina for regular activities  3 4   3. Walking for exercise  3 4   4.      Total Score 3.7 4.3     Total Score = Sum of activity scores/number of activities  Minimally Detectable Change: 3 points (for single activity); 2 points (for average score)  [facility] (nd). The Patient Specific Functional Scale . Retrieved from [email or web address]   COGNITION: Overall cognitive status: impulsive, poor safety awareness and insight into deficits      SENSATION: Not tested    LOWER EXTREMITY MMT:  MMT Right eval Left eval  Hip flexion 3 3  Hip extension    Hip abduction    Hip adduction    Hip internal rotation    Hip external rotation    Knee flexion 5 4  Knee extension 5 4  Ankle dorsiflexion 5 5  Ankle plantarflexion    Ankle inversion    Ankle eversion     (Blank rows = not tested)    FUNCTIONAL TESTS:  3 minute walk test: 555ft no device, intermittently unsteady and reaching for walls, intermittent MinA  Dynamic Gait Index:      09/28/23 0001  Standardized Balance Assessment  Standardized Balance Assessment Dynamic Gait Index  Dynamic Gait Index  Level Surface 2  Change in Gait Speed 2  Gait with Horizontal Head Turns 1  Gait with Vertical Head Turns 0  Gait and Pivot Turn 2  Step Over Obstacle 2  Step Around Obstacles 1  Steps 2  Total Score 12       GAIT: Distance walked: 549ft  Assistive device utilized: None Level of assistance: Min A Comments: unsteady in general without device  TREATMENT DATE:  12/16/23 NuStep L4x23mins LE only  LAQ 2# 2x10 Taps on 4 CGA  STS 2x5 elevated mat no hands  Seated row 2x10 Standing ball toss   12/13/23 Nustep level 3 x 5 minutes Seated pball stretch Standing with walker marches, hip abduction CGA STM to the upper traps dn the upper buttocks 15# HS curls LAQ no weight Seated red tband row 5# standing straight arm pulls but very uncoordinated requiring CGA Red tband hip adduction and abduction single legs REd tband ankle exercises poor control Foot on dyna disc heel to toe, poor control HHA side stepping, fwd and backward walking  12/06/23 NuStep L2 x10mins  Seated stretch with pball x10 Seated clamshell red 2x10 Ball squeeze 2x10 LAQ 2x10   12/02/23 STM to the upper traps, neck and rhomboid Passive stretching to HS and K2C. Difficult time relaxing an allow passive motions [employer] b/n knees squeeze  11/29/23 STM to the upper traps, neck and rhomboids Feet on ball K2C, rotation, small bridge, isometric abs very poor control and tolerance reports a lot of pain Ball b/n knees squeeze Yellow tband clamshells Marches LAQ Yellow tband HS curls Toe taps  11/04/23 STS x5- needs cues to come down slow and controlled, tends to plop NuStep L3 x35mins  LAQ x10 HS curls red x10 Ball squeeze 2x10 Attempted some STM  but very jumpy and has increased pain with just light touch  10/14/23 Per subjective 10/14/23:   [name] at [facility]  10/08/23 (chair broke and fell straight back), hit head and back, arms shoulders. Urgent care did imaging and found no fractures. Saw neurologist and they told us  to come back to PT to see what she can and can't do. Going back to neurologist on the 14th, this doctor wanted to see what she can and can't do. Needs a lot more help getting dressed, limping a lot.    Attempted nustep on lowest resistance settings but pt was unable to tolerate and did not want to continue with visit.   Assisted pt and daughter in ambulating out to the car, had our front desk cancel all PT appts up to next neurologist appt. Will await neurologist assessment/reccs and if appropriate will try PT again at that time.   No charge for today's visit 09/30/23  Nustep L5x8 minutes all four extremities, seat 10  Bridge + ABD into green TB x10 Sidelying clams green TB x10 B Seated LAQs green TB x10 B  Tandem stance solid surface 2x30 seconds B Side steps in // bars x4 laps solid surface  Alternating toe taps 4 inch box x20 solid surface  Forward step ups onto blue foam pad x10 B Lateral step ups onto blue foam pad x10 B  09/28/23  Eval, POC, HEP and education as below    Nustep L5 x6 minutes BLEs only seat 10  PATIENT EDUCATION:  Education details: exam, POC, HEP, education about fall risk and recovery post CVA, use RW ideally to reduce fall risk  Person educated: Patient and Child(ren) Education method: Explanation, Demonstration, and Handouts Education comprehension: verbalized understanding, returned demonstration, and needs further education  HOME EXERCISE PROGRAM:  Access Code: [email or web address] URL: [email or web address] Date: 09/30/2023 Prepared by: Josette Rough  Exercises - Supine Bridge with Resistance Band  - 1 x daily - 7 x weekly - 1-2 sets - 10 reps - 2 seconds   hold - Clamshell with Resistance  - 1 x daily - 7 x weekly - 1-2 sets -  10 reps - 1 seconds  hold - Seated Knee Extension with Anchored Resistance  - 1 x daily - 7 x weekly - 1-2 sets - 10 reps - 2 seconds  hold - Tandem Stance in Corner  - 1 x daily - 7 x weekly - 1-2 sets - 6 reps - 15-30 seconds  hold  ASSESSMENT:  CLINICAL IMPRESSION:  Patient doing better today, she is able to tolerate exercises better and was able to walk to and from machines without the cane.   was able to go to church yesterday, walking with a SPC, with exercises she really has poor control and coordination of the LE's, has a lot of extraneous motions in the hips, knees and ankles, if we do both legs at the same time it seems to be worse, I did some exercise singularly, it helps some but still very poor control, a lot of jerky motions.   EVAL: Patient is a 83 y.o. F who was seen today for physical therapy evaluation and treatment for I63.9 (ICD-10-CM) - Cerebrovascular accident (CVA), unspecified mechanism (HCC. Very high fall risk, required a lot of education and cues for safety. Daughter present for eval and very supportive, will be doing pool walking and exercise with her. Anticipate with family support that she will improve with skilled PT services.    OBJECTIVE IMPAIRMENTS: Abnormal gait, decreased activity tolerance, decreased balance, decreased cognition, decreased coordination, decreased mobility, difficulty walking, decreased strength, and decreased safety awareness.   ACTIVITY LIMITATIONS: sitting, standing, squatting, transfers, and locomotion level  PARTICIPATION LIMITATIONS: meal prep, cleaning, driving, shopping, community activity, and yard work  PERSONAL FACTORS: Age, Behavior pattern, Education, Fitness, Past/current experiences, and Time since onset of injury/illness/exacerbation are also affecting patient's functional outcome.   REHAB POTENTIAL: Good  CLINICAL DECISION MAKING:  Stable/uncomplicated  EVALUATION COMPLEXITY: Low   GOALS: Goals reviewed with patient? No  SHORT TERM GOALS: Target date: 10/26/2023   Will be compliant with appropriate progressive HEP  Baseline: Goal status: met 12/13/23  2.  Will be able to name 3 ways to reduce fall risk at home and in the community  Baseline:  Goal status: needs more education on fall risk and maybe using something more stable than cane 11/29/23  3.  Concerns regarding L LE burning and strength to have resolved by at least 75% Baseline:  Goal status: no improvement 11/29/23  4.  Will be compliant with use of appropriate LRAD to reduce fall risk  Baseline:  Goal status: met 12/13/23    LONG TERM GOALS: Target date: 12/29/23    MMT to be 5/5 in all tested groups  Baseline:  Goal status: INITIAL  2.  Will score at least 18 on DGI to show reduced fall risk  Baseline:  Goal status: INITIAL  3.  Will have been able to return to walking for exercise with family as desired  Baseline:  Goal status: progressing 12/06/23  4.  PSFS to have improved by at least 2 points  Baseline:  Goal status: progressing 12/06/23     PLAN:  PT FREQUENCY: 2x/week  PT DURATION: 8 weeks  PLANNED INTERVENTIONS: 97750- Physical Performance Testing, 97110-Therapeutic exercises, 97530- Therapeutic activity, [id number]- Neuromuscular re-education, 97535- Self Care, [id number]- Manual therapy, [id number]- Gait training, and [phone]- Aquatic Therapy  PLAN FOR NEXT SESSION: focus on balance and safety with functional mobility, strength and gait. Work on attention and safety awareness in general, slow her down and work on sequencing and coordination. ESTIM, slow movements as tolerated  Ozell Mainland, PT 12/16/23 6:34 PM

## 2023-12-21 ENCOUNTER — Ambulatory Visit: Admitting: Physical Therapy

## 2023-12-22 ENCOUNTER — Ambulatory Visit: Admitting: Podiatry

## 2023-12-22 DIAGNOSIS — M722 Plantar fascial fibromatosis: Secondary | ICD-10-CM | POA: Diagnosis not present

## 2023-12-22 MED ORDER — METHYLPREDNISOLONE 4 MG PO TBPK: ORAL_TABLET | ORAL | 0 refills | Status: AC

## 2023-12-22 NOTE — Progress Notes (Signed)
  Subjective:  Patient ID: Courtney Holmes, female    DOB: September 22, 1940,  MRN: [id number]  Chief Complaint  Patient presents with   Plantar Fasciitis    83 y.o. female presents with the above complaint.  Patient presents for follow-up of left plantar fasciitis she states that she is doing a lot better.  Denies any other acute complaints.   Review of Systems: Negative except as noted in the HPI. Denies N/V/F/Ch.  Past Medical History:  Diagnosis Date   Hypertension    Stroke (cerebrum) (HCC)     Current Outpatient Medications:    methylPREDNISolone  (MEDROL  DOSEPAK) 4 MG TBPK tablet, Take as directed, Disp: 21 each, Rfl: 0   acetaminophen  (TYLENOL ) 500 MG tablet, Take 500 mg by mouth 2 (two) times daily as needed for mild pain (pain score 1-3), moderate pain (pain score 4-6) or headache., Disp: , Rfl:    aspirin  EC 81 MG tablet, Take 1 tablet (81 mg total) by mouth daily. Swallow whole., Disp: , Rfl:    atorvastatin  (LIPITOR) 20 MG tablet, Take 1 tablet (20 mg total) by mouth daily., Disp: 90 tablet, Rfl: 2   meloxicam  (MOBIC ) 15 MG tablet, Take 1 tablet (15 mg total) by mouth daily., Disp: 30 tablet, Rfl: 0   methylPREDNISolone  (MEDROL  DOSEPAK) 4 MG TBPK tablet, Take as directed, Disp: 21 each, Rfl: 0  Social History   Tobacco Use  Smoking Status Never  Smokeless Tobacco Not on file    No Known Allergies Objective:  There were no vitals filed for this visit. There is no height or weight on file to calculate BMI. Constitutional Well developed. Well nourished.  Vascular Dorsalis pedis pulses palpable bilaterally. Posterior tibial pulses palpable bilaterally. Capillary refill normal to all digits.  No cyanosis or clubbing noted. Pedal hair growth normal.  Neurologic Normal speech. Oriented to person, place, and time. Epicritic sensation to light touch grossly present bilaterally.  Dermatologic Nails well groomed and normal in appearance. No open wounds. No skin lesions.   Orthopedic: Normal joint ROM without pain or crepitus bilaterally. No visible deformities. No further tender to palpation at the calcaneal tuber left. No pain with calcaneal squeeze left. Ankle ROM diminished range of motion left. Silfverskiold Test: positive left.   Radiographs: None  Assessment:   1. Plantar fasciitis of left foot     Plan:  Patient was evaluated and treated and all questions answered.  Plantar Fasciitis, left - Clinically healed and officially discharged from my care.  I discussed shoe gear modification orthotics management if any foot and ankle issues arise in the future she will come back and see me..  No follow-ups on file.

## 2023-12-24 ENCOUNTER — Ambulatory Visit: Admitting: Physical Therapy

## 2024-01-04 ENCOUNTER — Ambulatory Visit: Admitting: Physical Therapy

## 2024-01-04 ENCOUNTER — Encounter: Payer: Self-pay | Admitting: Physical Therapy

## 2024-01-04 DIAGNOSIS — R262 Difficulty in walking, not elsewhere classified: Secondary | ICD-10-CM

## 2024-01-04 DIAGNOSIS — R2681 Unsteadiness on feet: Secondary | ICD-10-CM

## 2024-01-04 DIAGNOSIS — M6281 Muscle weakness (generalized): Secondary | ICD-10-CM

## 2024-01-04 NOTE — Therapy (Signed)
 OUTPATIENT PHYSICAL THERAPY LOWER EXTREMITY TREATMENT    Patient Name: Courtney Holmes MRN: [id number] DOB:1941/01/22, 83 y.o., female Today's Date: 01/04/2024  END OF SESSION:  PT End of Session - 01/04/24 1428     Visit Number 9    Date for Recertification  12/29/23    PT Start Time 1430    PT Stop Time 0155    PT Time Calculation (min) 685 min    Activity Tolerance Patient tolerated treatment well    Behavior During Therapy Impulsive;WFL for tasks assessed/performed              Past Medical History:  Diagnosis Date   Hypertension    Stroke (cerebrum) [facility]    Past Surgical History:  Procedure Laterality Date   TUBAL LIGATION     Patient Active Problem List   Diagnosis Date Noted   Acute CVA (cerebrovascular accident) (HCC) 09/09/2023   Left shoulder pain 09/09/2023   Knee joint pain 09/09/2023   Chronic pain syndrome 09/09/2023    PCP: Joshua Francisco MD   REFERRING PROVIDER: Patsy Lenis, MD  REFERRING DIAG: I63.9 (ICD-10-CM) - Cerebrovascular accident (CVA), unspecified mechanism (HCC  THERAPY DIAG:  Unsteadiness on feet  Difficulty in walking, not elsewhere classified  Muscle weakness (generalized)  Rationale for Evaluation and Treatment: Rehabilitation  ONSET DATE: CVA found on imaging 09/09/23  SUBJECTIVE:   SUBJECTIVE STATEMENT: I have not been been too bad  EVAL: Still having a lot of fatigue since the stroke. L leg is burning more after the stroke, worse in the mornings but staying about the same. Yesterday was a better day. I was getting the left shoulder looked at before the CVA, it still hurts. Having a hard time sitting for a long period of time due to LE burning. Moving LE helps burning.   PERTINENT HISTORY: See above  PAIN:  Are you having pain? Yes: NPRS scale: 6/10 Pain location: back Pain description: burning Aggravating factors: sitting for a long time  Relieving factors: movement   PRECAUTIONS: Fall  RED  FLAGS: None   WEIGHT BEARING RESTRICTIONS: No  FALLS:  Has patient fallen in last 6 months? 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  DIAGNOSTIC FINDINGS:   CLINICAL DATA:  144754 Shoulder pain 144754   EXAM: LEFT SHOULDER - 2+ VIEW   COMPARISON:  October 26, 2014   FINDINGS: Osteopenia.No acute fracture or dislocation. Moderate AC joint space loss, unchanged. Apparent cardiomegaly.   IMPRESSION: 1. Osteopenia.  No acute fracture or dislocation. 2. Moderate osteoarthritis of the AC joint.  CLINICAL DATA:  Knot on the knee, uncontrollable twitching and moving   EXAM: LEFT KNEE - 1-2 VIEW   COMPARISON:  None Available.   FINDINGS: Osteopenia.No acute fracture or dislocation. Trace joint effusion. Moderate tricompartmental joint space loss with osteophyte formation. Quadriceps insertion enthesophyte. Soft tissues are unremarkable.   IMPRESSION: 1. Trace joint effusion.  No acute fracture or dislocation. 2. Moderate tricompartmental osteoarthritis of the knee.  CLINICAL DATA:  Transient  ischemic attack (TIA) Left arm / leg weakness - intermittent x 4 days   EXAM: MRI HEAD WITHOUT CONTRAST   TECHNIQUE: Multiplanar, multiecho pulse sequences of the brain and surrounding structures were obtained without intravenous contrast.   COMPARISON:  CT head 02/28/2023.   FINDINGS: Brain: Small acute or early subacute infarct in the right  basal ganglia (series 5, image 31). Mild associated edema without mass effect. No midline shift, acute hemorrhage, mass lesion or hydrocephalus.   Vascular: Major arterial flow voids are maintained at the skull base.   Skull and upper cervical spine: Normal marrow signal.   Sinuses/Orbits: Mild paranasal sinus mucosal thickening. No acute orbital findings.   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  PATIENT SURVEYS:  PSFS: THE PATIENT SPECIFIC FUNCTIONAL SCALE  Place score of 0-10 (0 = unable to perform activity and 10 = able to perform activity at the same level as before injury or problem)  Activity Date: 09/28/23 12/06/23 01/04/24  Sitting too long  5 5 5   2. Stamina for regular activities  3 4 5   3. Walking for exercise  3 4 3   4.      Total Score 3.7 4.3 4.33    Total Score = Sum of activity scores/number of activities  Minimally Detectable Change: 3 points (for single activity); 2 points (for average score)  [employer] [facility] (nd). The Patient Specific Functional Scale . Retrieved from [email or web address]   COGNITION: Overall cognitive status: impulsive, poor safety awareness and insight into deficits      SENSATION: Not tested    LOWER EXTREMITY MMT:  MMT Right eval Left eval 12/25/23 Right 01/04/24 Left  Hip flexion 3 3 4 3   Hip extension      Hip abduction      Hip adduction      Hip internal rotation      Hip external rotation      Knee flexion 5 4 5 4   Knee extension 5 4 5 4   Ankle dorsiflexion 5 5 5 5   Ankle plantarflexion      Ankle inversion      Ankle eversion       (Blank rows = not tested)    FUNCTIONAL TESTS:  3 minute walk test: 575ft no device, intermittently unsteady and reaching for walls, intermittent MinA  Dynamic Gait Index:     09/28/23 0001  Standardized Balance Assessment  Standardized Balance Assessment Dynamic Gait Index   Dynamic Gait Index  Level Surface 2  Change in Gait Speed 2  Gait with Horizontal Head Turns 1  Gait with Vertical Head Turns 0  Gait and Pivot Turn 2  Step Over Obstacle 2  Step Around Obstacles 1  Steps 2  Total Score 12       GAIT: Distance walked: 521ft  Assistive device utilized: None Level of assistance: Min A Comments: unsteady in general without device  TREATMENT DATE:  01/04/24 NuStep L 4 x 6 min increase time needed due to pt stopping multiple times  Goals   Careful on the steps   Walking on Clean floors  Sit to stands 2x5 w/ UE use  LAQ 3lb 2x10  12/16/23 NuStep L4x89mins LE only  LAQ 2# 2x10 Taps on 4 CGA  STS 2x5 elevated mat no hands  Seated row 2x10 Standing ball toss   12/13/23 Nustep level 3 x 5 minutes Seated pball stretch Standing with walker marches, hip abduction CGA STM to the upper traps dn the upper buttocks 15# HS curls LAQ no weight Seated red tband row 5# standing straight arm pulls but very uncoordinated requiring CGA Red tband hip adduction and abduction single legs REd tband ankle exercises poor control Foot on dyna disc heel to toe, poor control HHA side stepping, fwd and backward walking  12/06/23 NuStep L2 x70mins  Seated stretch with pball x10 Seated clamshell red 2x10 Ball squeeze 2x10 LAQ 2x10   12/02/23 STM to the upper traps, neck and rhomboid Passive stretching to HS and K2C. Difficult time relaxing an allow passive motions [employer] b/n knees squeeze  11/29/23 STM to the upper traps, neck and rhomboids Feet on ball K2C, rotation, small bridge, isometric abs very poor control and tolerance reports a lot of pain Ball b/n knees squeeze Yellow tband clamshells Marches LAQ Yellow tband HS curls Toe taps  11/04/23 STS x5- needs cues to come down slow and controlled, tends to  plop NuStep L3 x33mins  LAQ x10 HS curls red x10 Ball squeeze 2x10 Attempted some STM but very jumpy and has increased pain with just light touch  10/14/23 Per subjective 10/14/23:   [name] at [facility]  10/08/23 (chair broke and fell straight back), hit head and back, arms shoulders. Urgent care did imaging and found no fractures. Saw neurologist and they told us  to come back to PT to see what she can and can't do. Going back to neurologist on the 14th, this doctor wanted to see what she can and can't do. Needs a lot more help getting dressed, limping a lot.    Attempted nustep on lowest resistance settings but pt was unable to tolerate and did not want to continue with visit.   Assisted pt and daughter in ambulating out to the car, had our front desk cancel all PT appts up to next neurologist appt. Will await neurologist assessment/reccs and if appropriate will try PT again at that time.   No charge for today's visit 09/30/23  Nustep L5x8 minutes all four extremities, seat 10  Bridge + ABD into green TB x10 Sidelying clams green TB x10 B Seated LAQs green TB x10 B  Tandem stance solid surface 2x30 seconds B Side steps in // bars x4 laps solid surface  Alternating toe taps 4 inch box x20 solid surface  Forward step ups onto blue foam pad x10 B Lateral step ups onto blue foam pad x10 B  09/28/23  Eval, POC, HEP and education as below    Nustep L5 x6 minutes BLEs only seat 10  PATIENT EDUCATION:  Education details: exam, POC, HEP, education about fall risk and recovery post CVA, use RW ideally to reduce fall risk  Person educated: Patient and Child(ren) Education method: Explanation, Demonstration, and Handouts Education comprehension: verbalized understanding, returned demonstration, and needs further education  HOME EXERCISE PROGRAM:  Access Code: [email or web address] URL: [email or web address] Date: 09/30/2023 Prepared by: Josette Rough  Exercises -  Supine Bridge with Resistance Band  - 1 x daily - 7 x weekly - 1-2 sets - 10 reps - 2 seconds  hold - Clamshell with Resistance  - 1 x daily - 7 x weekly - 1-2 sets - 10 reps - 1 seconds  hold - Seated Knee Extension with Anchored Resistance  - 1 x daily - 7 x weekly - 1-2 sets - 10 reps - 2 seconds  hold - Tandem Stance in Corner  - 1 x daily - 7 x weekly - 1-2 sets - 6 reps - 15-30 seconds  hold  ASSESSMENT:  CLINICAL IMPRESSION:  Patient doing better today, goals accessed. Limited Progress made towards goals.  Difficulty with MMT due to cognitive limitation or decrease motor control. Time spent talking about the Pt PCP and hi not having all her medical records. Tried to explain to pt that MD at the ER sent here, and her PCP was not connected to the system. Gave pt the option to call and have her medical records released to PCP.  Pt  she is able to tolerate exercises better and was able to walk in clinic without AD    EVAL: Patient is a 83 y.o. F who was seen today for physical therapy evaluation and treatment for I63.9 (ICD-10-CM) - Cerebrovascular accident (CVA), unspecified mechanism (HCC. Very high fall risk, required a lot of education and cues for safety. Daughter present for eval and very supportive, will be doing pool walking and exercise with her. Anticipate with family support that she will improve with skilled PT services.    OBJECTIVE IMPAIRMENTS: Abnormal gait, decreased activity tolerance, decreased balance, decreased cognition, decreased coordination, decreased mobility, difficulty walking, decreased strength, and decreased safety awareness.   ACTIVITY LIMITATIONS: sitting, standing, squatting, transfers, and locomotion level  PARTICIPATION LIMITATIONS: meal prep, cleaning, driving, shopping, community activity, and yard work  PERSONAL FACTORS: Age, Behavior pattern, Education, Fitness, Past/current experiences, and Time since onset of injury/illness/exacerbation are also  affecting patient's functional outcome.   REHAB POTENTIAL: Good  CLINICAL DECISION MAKING: Stable/uncomplicated  EVALUATION COMPLEXITY: Low   GOALS: Goals reviewed with patient? No  SHORT TERM GOALS: Target date: 10/26/2023   Will be compliant with appropriate progressive HEP  Baseline: Goal status: met 12/13/23  2.  Will be able to name 3 ways to reduce fall risk at home and in the community  Baseline:  Goal status: needs more education on fall risk and maybe using something more stable than cane 11/29/23,  Progressing can name 2 ways 01/04/24  3.  Concerns regarding L LE burning and strength to have resolved by at least 75% Baseline:  Goal status: no improvement 11/29/23, on going 01/04/24  4.  Will be compliant with use of appropriate LRAD to reduce fall risk  Baseline:  Goal status: met 12/13/23    LONG TERM GOALS: Target date: 12/29/23    MMT to be 5/5 in all tested groups  Baseline:  Goal status: ongoing 01/04/24  2.  Will score at least 18 on DGI to show reduced fall risk  Baseline:  Goal status: INITIAL  3.  Will have been able to return to walking for exercise with family as desired  Baseline:  Goal status: progressing 12/06/23, ongoing 01/04/24  4.  PSFS to have improved by at least 2 points  Baseline:  Goal status: progressing 12/06/23, Ongoing 01/04/24     PLAN:  PT FREQUENCY: 2x/week  PT DURATION: 8 weeks  PLANNED INTERVENTIONS: 97750- Physical Performance Testing, 97110-Therapeutic  exercises, 97530- Therapeutic activity, [id number]- Neuromuscular re-education, [id number]- Self Care, [id number]- Manual therapy, [id number]- Gait training, and [phone]- Aquatic Therapy  PLAN FOR NEXT SESSION: focus on balance and safety with functional mobility, strength and gait. Work on attention and safety awareness in general, slow her down and work on sequencing and coordination. ESTIM, slow movements as tolerated   Ozell Mainland, PT 01/04/24 2:29 PM

## 2024-01-19 ENCOUNTER — Ambulatory Visit: Admitting: Podiatry

## 2024-02-09 ENCOUNTER — Ambulatory Visit: Attending: Internal Medicine

## 2024-02-09 ENCOUNTER — Other Ambulatory Visit: Payer: Self-pay

## 2024-02-09 DIAGNOSIS — M6281 Muscle weakness (generalized): Secondary | ICD-10-CM | POA: Insufficient documentation

## 2024-02-09 DIAGNOSIS — R2681 Unsteadiness on feet: Secondary | ICD-10-CM | POA: Insufficient documentation

## 2024-02-09 DIAGNOSIS — R262 Difficulty in walking, not elsewhere classified: Secondary | ICD-10-CM | POA: Insufficient documentation

## 2024-02-09 NOTE — Therapy (Signed)
 OUTPATIENT PHYSICAL THERAPY LOWER EXTREMITY TREATMENT    Patient Name: Courtney Holmes MRN: [id number] DOB:21-Oct-1940, 83 y.o., female Today's Date: 02/09/2024  END OF SESSION:  PT End of Session - 02/09/24 0931     Visit Number 10    Date for Recertification  04/05/24    Progress Note Due on Visit 20    PT Start Time 0930    PT Stop Time 1005    PT Time Calculation (min) 35 min    Activity Tolerance Patient tolerated treatment well    Behavior During Therapy Impulsive;WFL for tasks assessed/performed               Past Medical History:  Diagnosis Date   Hypertension    Stroke (cerebrum) [facility])    Past Surgical History:  Procedure Laterality Date   TUBAL LIGATION     Patient Active Problem List   Diagnosis Date Noted   Acute CVA (cerebrovascular accident) (HCC) 09/09/2023   Left shoulder pain 09/09/2023   Knee joint pain 09/09/2023   Chronic pain syndrome 09/09/2023    PCP: Joshua Francisco MD   REFERRING PROVIDER: Patsy Lenis, MD  REFERRING DIAG: I63.9 (ICD-10-CM) - Cerebrovascular accident (CVA), unspecified mechanism (HCC  THERAPY DIAG:  Unsteadiness on feet  Difficulty in walking, not elsewhere classified  Muscle weakness (generalized)  Rationale for Evaluation and Treatment: Rehabilitation  ONSET DATE: CVA found on imaging 09/09/23  SUBJECTIVE:   SUBJECTIVE STATEMENT: 02/09/24: reports gap of 5 weeks since her last appt due to bleeding hemorrhoids, has been to MD several times.  She reports fearful of falling, hasn't been able to really walk for exercise.  Living with her daughter and son in law, her bedroom is upstairs, her daughter wants her to move to the main level but she is insistent on remaining in her second floor bedroom as she feels it is good for her to have to use the steps.  There are 2 rails which she utilizes for safety  EVAL: Still having a lot of fatigue since the stroke. L leg is burning more after the stroke, worse in the  mornings but staying about the same. Yesterday was a better day. I was getting the left shoulder looked at before the CVA, it still hurts. Having a hard time sitting for a long period of time due to LE burning. Moving LE helps burning.   PERTINENT HISTORY: See above  PAIN:  Are you having pain? Yes: NPRS scale: 6/10 Pain location: back Pain description: burning Aggravating factors: sitting for a long time  Relieving factors: movement   PRECAUTIONS: Fall  RED FLAGS: None   WEIGHT BEARING RESTRICTIONS: No  FALLS:  Has patient fallen in last 6 months? 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  DIAGNOSTIC FINDINGS:   CLINICAL DATA:  [id number] Shoulder pain 144754   EXAM: LEFT SHOULDER - 2+ VIEW   COMPARISON:  October 26, 2014   FINDINGS: Osteopenia.No acute fracture or dislocation. Moderate AC joint space loss, unchanged. Apparent cardiomegaly.  IMPRESSION: 1. Osteopenia.  No acute fracture or dislocation. 2. Moderate osteoarthritis of the AC joint.  CLINICAL DATA:  Knot on the knee, uncontrollable twitching and moving   EXAM: LEFT KNEE - 1-2 VIEW   COMPARISON:  None Available.   FINDINGS: Osteopenia.No acute fracture or dislocation. Trace joint effusion. Moderate tricompartmental joint space loss with osteophyte formation. Quadriceps insertion enthesophyte. Soft tissues are unremarkable.   IMPRESSION: 1. Trace joint effusion.  No acute  fracture or dislocation. 2. Moderate tricompartmental osteoarthritis of the knee.  CLINICAL DATA:  Transient ischemic attack (TIA) Left arm / leg weakness - intermittent x 4 days   EXAM: MRI HEAD WITHOUT CONTRAST   TECHNIQUE: Multiplanar, multiecho pulse sequences of the brain and surrounding structures were obtained without intravenous contrast.   COMPARISON:  CT head 02/28/2023.   FINDINGS: Brain: Small acute or early subacute infarct in the right basal ganglia (series 5, image 31). Mild associated edema without mass effect. No midline shift, acute hemorrhage, mass lesion or hydrocephalus.   Vascular: Major arterial flow voids are maintained at the skull base.   Skull and upper cervical spine: Normal marrow signal.   Sinuses/Orbits: Mild paranasal sinus mucosal thickening. No acute orbital findings.   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  PATIENT SURVEYS:  PSFS: THE PATIENT SPECIFIC FUNCTIONAL SCALE  Place score of 0-10 (0 = unable to perform activity and 10 = able to perform activity at the same level as before injury or problem)  Activity Date: 09/28/23 12/06/23 01/04/24 02/09/24  Sitting too long  5 5 5 3   2. Stamina for regular activities  3 4 5 5   3. Walking for exercise  3 4 3 2   4.       Total Score 3.7 4.3 4.33 3.33    Total Score = Sum of activity scores/number of activities  Minimally Detectable Change: 3 points (for single activity); 2 points (for average score)  [facility] (nd). The Patient Specific Functional Scale . Retrieved from [email or web address]   COGNITION: Overall cognitive status: impulsive, poor safety awareness and insight into deficits      SENSATION: Not tested    LOWER EXTREMITY MMT:  MMT Right eval Left eval 12/25/23 Right 01/04/24 Left 02/09/24  Hip flexion 3 3 4 3  3-  Hip extension       Hip abduction       Hip  adduction       Hip internal rotation       Hip external rotation       Knee flexion 5 4 5 4  3+  Knee extension 5 4 5 4  3+  Ankle dorsiflexion 5 5 5 5  3+  Ankle plantarflexion       Ankle inversion       Ankle eversion        (Blank rows = not tested)    FUNCTIONAL TESTS:  3 minute walk test: 573ft no device, intermittently unsteady and reaching for walls, intermittent MinA  Dynamic Gait Index:     09/28/23 0001  Standardized Balance Assessment  Standardized Balance Assessment Dynamic Gait Index  Dynamic Gait Index  Level Surface 2  Change in Gait Speed 2  Gait with Horizontal Head Turns 1  Gait with Vertical Head Turns 0  Gait and Pivot Turn 2  Step Over Obstacle 2  Step Around Obstacles 1  Steps 2  Total Score 12       GAIT: Distance walked: 546ft  Assistive device utilized: None Level of assistance: Min A Comments: unsteady in general without device                                                                                                                                 TREATMENT DATE:  02/09/24: Reassessment for POC: DGI , MMT B knee ext 5# 10 reps B knee flexion 25# 10 reps Sit to stand from hi/low mat 5 x 2 with forward punches with 2 # mediball Nustep L 5 x 6 min  Shortened treatment session, stopped 10 min early as pt was concerned that her daughter, who dropped her off would be late for work.   01/04/24 NuStep L 4 x 6 min increase time needed due to pt stopping multiple times  Goals   Careful on the steps   Walking on Clean floors  Sit to stands 2x5 w/ UE use  LAQ 3lb 2x10  12/16/23 NuStep L4x52mins LE only  LAQ 2# 2x10 Taps on 4 CGA  STS 2x5 elevated mat no hands  Seated row 2x10 Standing ball toss   12/13/23 Nustep level 3 x 5 minutes Seated pball stretch Standing with walker marches, hip abduction CGA STM to the upper traps dn the upper buttocks 15# HS curls LAQ no weight Seated red tband row 5# standing straight arm pulls  but very uncoordinated requiring CGA Red tband hip adduction and abduction single legs REd tband ankle exercises poor control Foot on dyna disc heel to toe, poor control HHA side stepping, fwd and backward walking  12/06/23 NuStep L2 x79mins  Seated stretch with pball x10 Seated clamshell red 2x10 Ball squeeze 2x10 LAQ 2x10   12/02/23 STM to the upper traps, neck and rhomboid Passive stretching to HS and K2C. Difficult time relaxing an allow passive motions [employer] b/n knees squeeze  11/29/23 STM to the upper traps, neck and rhomboids Feet on ball K2C, rotation, small bridge, isometric abs very poor control and tolerance reports a lot of pain Ball b/n knees squeeze Yellow tband clamshells Marches LAQ Yellow tband HS curls Toe taps  11/04/23 STS x5- needs cues to come down slow and controlled, tends to plop NuStep L3 x60mins  LAQ x10 HS curls red x10 Ball squeeze 2x10 Attempted some STM but very jumpy and has increased pain with just light touch  10/14/23 Per subjective 10/14/23:   [name] at [facility]  10/08/23 (chair broke and fell straight back), hit head and back, arms shoulders. Urgent care did imaging and found no fractures. Saw neurologist and they told us  to come back to PT to see what she can and can't do. Going back to neurologist on the 14th, this doctor wanted to see what she can and can't do. Needs a lot more help getting dressed, limping a lot.    Attempted nustep on lowest resistance settings but pt was unable to tolerate and did not want to continue  with visit.   Assisted pt and daughter in ambulating out to the car, had our front desk cancel all PT appts up to next neurologist appt. Will await neurologist assessment/reccs and if appropriate will try PT again at that time.   No charge for today's visit 09/30/23  Nustep L5x8 minutes all four extremities, seat 10  Bridge + ABD into green TB x10 Sidelying clams green TB x10 B Seated LAQs  green TB x10 B  Tandem stance solid surface 2x30 seconds B Side steps in // bars x4 laps solid surface  Alternating toe taps 4 inch box x20 solid surface  Forward step ups onto blue foam pad x10 B Lateral step ups onto blue foam pad x10 B  09/28/23  Eval, POC, HEP and education as below    Nustep L5 x6 minutes BLEs only seat 10  PATIENT EDUCATION:  Education details: exam, POC, HEP, education about fall risk and recovery post CVA, use RW ideally to reduce fall risk  Person educated: Patient and Child(ren) Education method: Explanation, Demonstration, and Handouts Education comprehension: verbalized understanding, returned demonstration, and needs further education  HOME EXERCISE PROGRAM:  Access Code: [email or web address] URL: [email or web address] Date: 09/30/2023 Prepared by: Josette Rough  Exercises - Supine Bridge with Resistance Band  - 1 x daily - 7 x weekly - 1-2 sets - 10 reps - 2 seconds  hold - Clamshell with Resistance  - 1 x daily - 7 x weekly - 1-2 sets - 10 reps - 1 seconds  hold - Seated Knee Extension with Anchored Resistance  - 1 x daily - 7 x weekly - 1-2 sets - 10 reps - 2 seconds  hold - Tandem Stance in Corner  - 1 x daily - 7 x weekly - 1-2 sets - 6 reps - 15-30 seconds  hold  ASSESSMENT:  CLINICAL IMPRESSION: 02/09/24:the patient attended physical therapy today after 5 week absence.  Reassessment reveals decline in overall strength, as well as a decline in her DGI score.  She has some coordination deficits or apraxia? L extremities.  Needed frequent redirection.  Extended her plan of care an additional 8 weeks.  She does have some concern regarding taxing her daughter to bring her to/from appts.  She has declined in her general activity level from her report due to her pain/bleeding hemorroids over the last 6 weeks as well.    Patient doing better today, goals accessed. Limited Progress made towards goals.  Difficulty with MMT due to cognitive limitation  or decrease motor control. Time spent talking about the Pt PCP and hi not having all her medical records. Tried to explain to pt that MD at the ER sent here, and her PCP was not connected to the system. Gave pt the option to call and have her medical records released to PCP.  Pt  she is able to tolerate exercises better and was able to walk in clinic without AD    EVAL: Patient is a 83 y.o. F who was seen today for physical therapy evaluation and treatment for I63.9 (ICD-10-CM) - Cerebrovascular accident (CVA), unspecified mechanism (HCC. Very high fall risk, required a lot of education and cues for safety. Daughter present for eval and very supportive, will be doing pool walking and exercise with her. Anticipate with family support that she will improve with skilled PT services.    OBJECTIVE IMPAIRMENTS: Abnormal gait, decreased activity tolerance, decreased balance, decreased cognition, decreased coordination, decreased mobility, difficulty walking, decreased strength, and decreased safety awareness.  ACTIVITY LIMITATIONS: sitting, standing, squatting, transfers, and locomotion level  PARTICIPATION LIMITATIONS: meal prep, cleaning, driving, shopping, community activity, and yard work  PERSONAL FACTORS: Age, Behavior pattern, Education, Fitness, Past/current experiences, and Time since onset of injury/illness/exacerbation are also affecting patient's functional outcome.   REHAB POTENTIAL: Good  CLINICAL DECISION MAKING: Stable/uncomplicated  EVALUATION COMPLEXITY: Low   GOALS: Goals reviewed with patient? No  SHORT TERM GOALS: Target date: 10/26/2023   Will be compliant with appropriate progressive HEP  Baseline: Goal status: met 12/13/23  2.  Will be able to name 3 ways to reduce fall risk at home and in the community  Baseline:  Goal status: needs more education on fall risk and maybe using something more stable than cane 11/29/23,  Progressing can name 2 ways 01/04/24  3.   Concerns regarding L LE burning and strength to have resolved by at least 75% Baseline:  Goal status: no improvement 11/29/23, on going 01/04/24  4.  Will be compliant with use of appropriate LRAD to reduce fall risk  Baseline:  Goal status: met 12/13/23    LONG TERM GOALS: Target date: 04/05/24, additional 8 weeks as of 02/09/24    MMT to be 5/5 in all tested groups  Baseline:  Goal status: ongoing 01/04/24  2.  Will score at least 18 on DGI to show reduced fall risk  Baseline:  Goal status: 02/09/24: 9, declined since initial eval  3.  Will have been able to return to walking for exercise with family as desired  Baseline:  Goal status: progressing 12/06/23, ongoing 01/04/24  4.  PSFS to have improved by at least 2 points  Baseline:  Goal status: progressing 12/06/23, Ongoing 01/04/24     PLAN:  PT FREQUENCY: 2x/week  PT DURATION: 8 weeks  PLANNED INTERVENTIONS: 97750- Physical Performance Testing, 97110-Therapeutic exercises, 97530- Therapeutic activity, 97112- Neuromuscular re-education, 97535- Self Care, [id number]- Manual therapy, [id number]- Gait training, and [phone]- Aquatic Therapy  PLAN FOR NEXT SESSION: focus on balance and safety with functional mobility, strength and gait. Work on attention and safety awareness in general, slow her down and work on sequencing and coordination. ESTIM, slow movements as tolerated   Ja Ohman, PT, DPT, OCS 02/09/24 10:31 AM

## 2024-02-15 ENCOUNTER — Ambulatory Visit: Admitting: Physical Therapy

## 2024-02-15 ENCOUNTER — Encounter: Payer: Self-pay | Admitting: Physical Therapy

## 2024-02-15 DIAGNOSIS — M6281 Muscle weakness (generalized): Secondary | ICD-10-CM

## 2024-02-15 DIAGNOSIS — R262 Difficulty in walking, not elsewhere classified: Secondary | ICD-10-CM

## 2024-02-15 DIAGNOSIS — R2681 Unsteadiness on feet: Secondary | ICD-10-CM

## 2024-02-15 NOTE — Therapy (Signed)
 OUTPATIENT PHYSICAL THERAPY LOWER EXTREMITY TREATMENT    Patient Name: Courtney Holmes MRN: [id number] DOB:[birth year], 83 y.o., female Today's Date: 02/15/2024  END OF SESSION:  PT End of Session - 02/15/24 0757     Visit Number 11    Number of Visits 17    Date for Recertification  04/05/24    Authorization Type [employer] [facility]    Authorization Time Period 09/28/23 to 11/23/23    Authorization - Number of Visits 11    Progress Note Due on Visit 20    PT Start Time 0800    PT Stop Time 0845    PT Time Calculation (min) 45 min    Activity Tolerance Patient tolerated treatment well    Behavior During Therapy Impulsive;WFL for tasks assessed/performed                Past Medical History:  Diagnosis Date   Hypertension    Stroke (cerebrum) [facility])    Past Surgical History:  Procedure Laterality Date   TUBAL LIGATION     Patient Active Problem List   Diagnosis Date Noted   Acute CVA (cerebrovascular accident) (HCC) 09/09/2023   Left shoulder pain 09/09/2023   Knee joint pain 09/09/2023   Chronic pain syndrome 09/09/2023    PCP: Joshua Francisco MD   REFERRING PROVIDER: Patsy Lenis, MD  REFERRING DIAG: I63.9 (ICD-10-CM) - Cerebrovascular accident (CVA), unspecified mechanism (HCC  THERAPY DIAG:  Unsteadiness on feet  Difficulty in walking, not elsewhere classified  Muscle weakness (generalized)  Rationale for Evaluation and Treatment: Rehabilitation  ONSET DATE: CVA found on imaging 09/09/23  SUBJECTIVE:   SUBJECTIVE STATEMENT:  Pt states that she has been compliant with HEP, report that her CT junction is painful and uncomfortable.   EVAL: Still having a lot of fatigue since the stroke. L leg is burning more after the stroke, worse in the mornings but staying about the same. Yesterday was a better day. I was getting the left shoulder looked at before the CVA, it still hurts. Having a hard time sitting for a long period of time due to LE burning. Moving LE helps  burning.   PERTINENT HISTORY: See above  PAIN:  Are you having pain? Yes: NPRS scale: 6/10 Pain location: back Pain description: burning Aggravating factors: sitting for a long time  Relieving factors: movement   PRECAUTIONS: Fall  RED FLAGS: None   WEIGHT BEARING RESTRICTIONS: No  FALLS:  Has patient fallen in last 6 months? 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  DIAGNOSTIC FINDINGS:   CLINICAL DATA:  [id number] Shoulder pain [id number]   EXAM: LEFT SHOULDER - 2+ VIEW   COMPARISON:  October 26, 2014   FINDINGS: Osteopenia.No acute fracture or dislocation. Moderate AC joint space loss, unchanged. Apparent cardiomegaly.   IMPRESSION: 1. Osteopenia.  No acute fracture or dislocation. 2. Moderate osteoarthritis of the AC joint.  CLINICAL DATA:  Knot on the knee, uncontrollable twitching and moving   EXAM: LEFT KNEE - 1-2 VIEW  COMPARISON:  None Available.   FINDINGS: Osteopenia.No acute fracture or dislocation. Trace joint effusion. Moderate tricompartmental joint space loss with osteophyte formation. Quadriceps insertion enthesophyte. Soft tissues are unremarkable.   IMPRESSION: 1. Trace joint effusion.  No acute fracture or dislocation. 2. Moderate tricompartmental osteoarthritis of the knee.  CLINICAL DATA:  Transient ischemic attack (TIA) Left arm / leg weakness - intermittent x 4 days   EXAM: MRI HEAD WITHOUT CONTRAST    TECHNIQUE: Multiplanar, multiecho pulse sequences of the brain and surrounding structures were obtained without intravenous contrast.   COMPARISON:  CT head 02/28/2023.   FINDINGS: Brain: Small acute or early subacute infarct in the right basal ganglia (series 5, image 31). Mild associated edema without mass effect. No midline shift, acute hemorrhage, mass lesion or hydrocephalus.   Vascular: Major arterial flow voids are maintained at the skull base.   Skull and upper cervical spine: Normal marrow signal.   Sinuses/Orbits: Mild paranasal sinus mucosal thickening. No acute orbital findings.   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  PATIENT SURVEYS:  PSFS: THE PATIENT SPECIFIC FUNCTIONAL SCALE  Place score of 0-10 (0 = unable to perform activity and 10 = able to perform activity at the same level as before injury or problem)  Activity Date: 09/28/23 12/06/23 01/04/24 02/09/24  Sitting too long  5 5 5 3   2. Stamina for regular activities  3 4 5 5   3. Walking for exercise  3 4 3 2   4.       Total Score 3.7 4.3 4.33 3.33    Total Score = Sum of activity scores/number of activities  Minimally Detectable Change: 3 points (for single activity); 2 points (for average score)  [facility] (nd). The Patient Specific Functional Scale . Retrieved from [email or web address]   COGNITION: Overall cognitive status: impulsive, poor safety awareness and insight into deficits      SENSATION: Not tested    LOWER EXTREMITY MMT:  MMT Right eval Left eval 12/25/23 Right 01/04/24 Left 02/09/24  Hip flexion 3 3 4 3  3-  Hip extension       Hip abduction       Hip adduction       Hip internal rotation       Hip external rotation       Knee flexion 5 4 5 4  3+  Knee extension 5 4 5 4  3+  Ankle dorsiflexion 5 5 5 5  3+  Ankle plantarflexion       Ankle inversion       Ankle eversion         (Blank rows = not tested)    FUNCTIONAL TESTS:  3 minute walk test: 565ft no device, intermittently unsteady and reaching for walls, intermittent MinA  Dynamic Gait Index:     09/28/23 0001  Standardized Balance Assessment  Standardized Balance Assessment Dynamic Gait Index  Dynamic Gait Index  Level Surface 2  Change in Gait Speed 2  Gait with Horizontal Head Turns 1  Gait with Vertical Head Turns 0  Gait and Pivot Turn 2  Step Over Obstacle 2  Step Around Obstacles 1  Steps 2  Total Score 12       GAIT: Distance walked: 568ft  Assistive device utilized: None Level of assistance: Min A Comments: unsteady in general without device  TREATMENT DATE:  02/15/24 Gait training with SPC on level surface, cues for sequencing and step through pattern Seated knee flexion extension with physioball 2x15 BLE  Seated PNF D1 LE pattern 3x10 BLE alternating between sets 3 way hip in standing x10 BLE  Lateral step down x10 BLE from foam pad  Seated hamstring stretch x30 in sitting     02/09/24: Reassessment for POC: DGI , MMT B knee ext 5# 10 reps B knee flexion 25# 10 reps Sit to stand from hi/low mat 5 x 2 with forward punches with 2 # mediball Nustep L 5 x 6 min  Shortened treatment session, stopped 10 min early as pt was concerned that her daughter, who dropped her off would be late for work.   01/04/24 NuStep L 4 x 6 min increase time needed due to pt stopping multiple times  Goals   Careful on the steps   Walking on Clean floors  Sit to stands 2x5 w/ UE use  LAQ 3lb 2x10  12/16/23 NuStep L4x67mins LE only  LAQ 2# 2x10 Taps on 4 CGA  STS 2x5 elevated mat no hands  Seated row 2x10 Standing ball toss   12/13/23 Nustep level 3 x 5 minutes Seated pball stretch Standing with walker marches, hip abduction CGA STM to the upper traps  dn the upper buttocks 15# HS curls LAQ no weight Seated red tband row 5# standing straight arm pulls but very uncoordinated requiring CGA Red tband hip adduction and abduction single legs REd tband ankle exercises poor control Foot on dyna disc heel to toe, poor control HHA side stepping, fwd and backward walking  12/06/23 NuStep L2 x19mins  Seated stretch with pball x10 Seated clamshell red 2x10 Ball squeeze 2x10 LAQ 2x10   12/02/23 STM to the upper traps, neck and rhomboid Passive stretching to HS and K2C. Difficult time relaxing an allow passive motions [employer] b/n knees squeeze  11/29/23 STM to the upper traps, neck and rhomboids Feet on ball K2C, rotation, small bridge, isometric abs very poor control and tolerance reports a lot of pain Ball b/n knees squeeze Yellow tband clamshells Marches LAQ Yellow tband HS curls Toe taps  11/04/23 STS x5- needs cues to come down slow and controlled, tends to plop NuStep L3 x49mins  LAQ x10 HS curls red x10 Ball squeeze 2x10 Attempted some STM but very jumpy and has increased pain with just light touch  10/14/23 Per subjective 10/14/23:   [name] at [facility]  10/08/23 (chair broke and fell straight back), hit head and back, arms shoulders. Urgent care did imaging and found no fractures. Saw neurologist and they told us  to come back to PT to see what she can and can't do. Going back to neurologist on the 14th, this doctor wanted to see what she can and can't do. Needs a lot more help getting dressed, limping a lot.    Attempted nustep on lowest resistance settings but pt was unable to tolerate and did not want to continue with visit.   Assisted pt and daughter in ambulating out to the car, had our front desk cancel all PT appts up to next neurologist appt. Will await neurologist assessment/reccs and if appropriate will try PT again at that time.   No charge for today's visit 09/30/23  Nustep L5x8 minutes all  four extremities, seat 10  Bridge + ABD into green TB x10 Sidelying clams green TB x10 B Seated LAQs green TB x10 B  Tandem stance  solid surface 2x30 seconds B Side steps in // bars x4 laps solid surface  Alternating toe taps 4 inch box x20 solid surface  Forward step ups onto blue foam pad x10 B Lateral step ups onto blue foam pad x10 B  09/28/23  Eval, POC, HEP and education as below    Nustep L5 x6 minutes BLEs only seat 10  PATIENT EDUCATION:  Education details: exam, POC, HEP, education about fall risk and recovery post CVA, use RW ideally to reduce fall risk  Person educated: Patient and Child(ren) Education method: Explanation, Demonstration, and Handouts Education comprehension: verbalized understanding, returned demonstration, and needs further education  HOME EXERCISE PROGRAM:  Access Code: [email or web address] URL: [email or web address] Date: 09/30/2023 Prepared by: Josette Rough  Exercises - Supine Bridge with Resistance Band  - 1 x daily - 7 x weekly - 1-2 sets - 10 reps - 2 seconds  hold - Clamshell with Resistance  - 1 x daily - 7 x weekly - 1-2 sets - 10 reps - 1 seconds  hold - Seated Knee Extension with Anchored Resistance  - 1 x daily - 7 x weekly - 1-2 sets - 10 reps - 2 seconds  hold - Tandem Stance in Corner  - 1 x daily - 7 x weekly - 1-2 sets - 6 reps - 15-30 seconds  hold  ASSESSMENT:  CLINICAL IMPRESSION:  Pt tolerated session well with the ability to demonstrate improved coordination andunderstanding of use of cane on R side with LLE deficits. Pt improves as cues fade and repetitions increase. Decreased quad control L>R with knee flexion and extension moments in both open and closed chain. Pt stands to benefit from continued skilled physical therapy to address deficit areas and restore safety with activities and participations at home and in the community.      EVAL: Patient is a 83 y.o. F who was seen today for physical therapy evaluation and  treatment for I63.9 (ICD-10-CM) - Cerebrovascular accident (CVA), unspecified mechanism (HCC. Very high fall risk, required a lot of education and cues for safety. Daughter present for eval and very supportive, will be doing pool walking and exercise with her. Anticipate with family support that she will improve with skilled PT services.    OBJECTIVE IMPAIRMENTS: Abnormal gait, decreased activity tolerance, decreased balance, decreased cognition, decreased coordination, decreased mobility, difficulty walking, decreased strength, and decreased safety awareness.   ACTIVITY LIMITATIONS: sitting, standing, squatting, transfers, and locomotion level  PARTICIPATION LIMITATIONS: meal prep, cleaning, driving, shopping, community activity, and yard work  PERSONAL FACTORS: Age, Behavior pattern, Education, Fitness, Past/current experiences, and Time since onset of injury/illness/exacerbation are also affecting patient's functional outcome.   REHAB POTENTIAL: Good  CLINICAL DECISION MAKING: Stable/uncomplicated  EVALUATION COMPLEXITY: Low   GOALS: Goals reviewed with patient? No  SHORT TERM GOALS: Target date: 10/26/2023   Will be compliant with appropriate progressive HEP  Baseline: Goal status: met 12/13/23  2.  Will be able to name 3 ways to reduce fall risk at home and in the community  Baseline:  Goal status: needs more education on fall risk and maybe using something more stable than cane 11/29/23,  Progressing can name 2 ways 01/04/24  3.  Concerns regarding L LE burning and strength to have resolved by at least 75% Baseline:  Goal status: no improvement 11/29/23, on going 01/04/24  4.  Will be compliant with use of appropriate LRAD to reduce fall risk  Baseline:  Goal status: met 12/13/23    LONG TERM GOALS:  Target date: 04/05/24, additional 8 weeks as of 02/09/24    MMT to be 5/5 in all tested groups  Baseline:  Goal status: ongoing 01/04/24  2.  Will score at least 18 on DGI to  show reduced fall risk  Baseline:  Goal status: 02/09/24: 9, declined since initial eval  3.  Will have been able to return to walking for exercise with family as desired  Baseline:  Goal status: progressing 12/06/23, ongoing 01/04/24  4.  PSFS to have improved by at least 2 points  Baseline:  Goal status: progressing 12/06/23, Ongoing 01/04/24     PLAN:  PT FREQUENCY: 2x/week  PT DURATION: 8 weeks  PLANNED INTERVENTIONS: 97750- Physical Performance Testing, 97110-Therapeutic exercises, 97530- Therapeutic activity, 97112- Neuromuscular re-education, 97535- Self Care, [id number]- Manual therapy, [id number]- Gait training, and [phone]- Aquatic Therapy  PLAN FOR NEXT SESSION: focus on balance and safety with functional mobility, strength and gait. Work on attention and safety awareness in general, slow her down and work on sequencing and coordination. ESTIM, slow movements as tolerated   Stann Ohara PT, DPT, CLT, CES 02/15/24 7:58 AM

## 2024-02-29 ENCOUNTER — Encounter: Payer: Self-pay | Admitting: Physical Therapy

## 2024-02-29 ENCOUNTER — Ambulatory Visit: Admitting: Physical Therapy

## 2024-02-29 DIAGNOSIS — R2681 Unsteadiness on feet: Secondary | ICD-10-CM | POA: Diagnosis not present

## 2024-02-29 DIAGNOSIS — M6281 Muscle weakness (generalized): Secondary | ICD-10-CM

## 2024-02-29 DIAGNOSIS — R262 Difficulty in walking, not elsewhere classified: Secondary | ICD-10-CM

## 2024-02-29 NOTE — Therapy (Signed)
 " OUTPATIENT PHYSICAL THERAPY LOWER EXTREMITY TREATMENT    Patient Name: Courtney Holmes MRN: [id number] DOB:1940/07/14, 83 y.o., female Today's Date: 02/29/2024  END OF SESSION:  PT End of Session - 02/29/24 1540     Visit Number 12    Number of Visits 17    Date for Recertification  04/05/24    Authorization Type [employer] MCR    Progress Note Due on Visit 20    PT Start Time 1537    PT Stop Time 1615    PT Time Calculation (min) 38 min    Activity Tolerance Patient tolerated treatment well    Behavior During Therapy Impulsive;WFL for tasks assessed/performed                 Past Medical History:  Diagnosis Date   Hypertension    Stroke (cerebrum) (HCC)    Past Surgical History:  Procedure Laterality Date   TUBAL LIGATION     Patient Active Problem List   Diagnosis Date Noted   Acute CVA (cerebrovascular accident) (HCC) 09/09/2023   Left shoulder pain 09/09/2023   Knee joint pain 09/09/2023   Chronic pain syndrome 09/09/2023    PCP: Joshua Francisco MD   REFERRING PROVIDER: Patsy Lenis, MD  REFERRING DIAG: I63.9 (ICD-10-CM) - Cerebrovascular accident (CVA), unspecified mechanism (HCC  THERAPY DIAG:  Unsteadiness on feet  Difficulty in walking, not elsewhere classified  Muscle weakness (generalized)  Rationale for Evaluation and Treatment: Rehabilitation  ONSET DATE: CVA found on imaging 09/09/23  SUBJECTIVE:   SUBJECTIVE STATEMENT:  Pt reports continued pain in CT junction, reports some fatigue with activity, but feels she is walking better since previous session.   EVAL: Still having a lot of fatigue since the stroke. L leg is burning more after the stroke, worse in the mornings but staying about the same. Yesterday was a better day. I was getting the left shoulder looked at before the CVA, it still hurts. Having a hard time sitting for a long period of time due to LE burning. Moving LE helps burning.   PERTINENT HISTORY: See above  PAIN:  Are  you having pain? Yes: NPRS scale: 6/10 Pain location: back Pain description: burning Aggravating factors: sitting for a long time  Relieving factors: movement   PRECAUTIONS: Fall  RED FLAGS: None   WEIGHT BEARING RESTRICTIONS: No  FALLS:  Has patient fallen in last 6 months? 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  DIAGNOSTIC FINDINGS:   CLINICAL DATA:  144754 Shoulder pain 144754   EXAM: LEFT SHOULDER - 2+ VIEW   COMPARISON:  October 26, 2014   FINDINGS: Osteopenia.No acute fracture or dislocation. Moderate AC joint space loss, unchanged. Apparent cardiomegaly.   IMPRESSION: 1. Osteopenia.  No acute fracture or dislocation. 2. Moderate osteoarthritis of the AC joint.  CLINICAL DATA:  Knot on the knee, uncontrollable twitching and moving   EXAM: LEFT KNEE - 1-2 VIEW   COMPARISON:  None Available.   FINDINGS: Osteopenia.No acute fracture or dislocation.  Trace joint effusion. Moderate tricompartmental joint space loss with osteophyte formation. Quadriceps insertion enthesophyte. Soft tissues are unremarkable.   IMPRESSION: 1. Trace joint effusion.  No acute fracture or dislocation. 2. Moderate tricompartmental osteoarthritis of the knee.  CLINICAL DATA:  Transient ischemic attack (TIA) Left arm / leg weakness - intermittent x 4 days   EXAM: MRI HEAD WITHOUT CONTRAST   TECHNIQUE: Multiplanar, multiecho pulse sequences of the  brain and surrounding structures were obtained without intravenous contrast.   COMPARISON:  CT head 02/28/2023.   FINDINGS: Brain: Small acute or early subacute infarct in the right basal ganglia (series 5, image 31). Mild associated edema without mass effect. No midline shift, acute hemorrhage, mass lesion or hydrocephalus.   Vascular: Major arterial flow voids are maintained at the skull base.   Skull and upper cervical spine: Normal marrow signal.   Sinuses/Orbits: Mild paranasal sinus mucosal thickening. No acute orbital findings.   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  PATIENT SURVEYS:  PSFS: THE PATIENT SPECIFIC FUNCTIONAL SCALE  Place score of 0-10 (0 = unable to perform activity and 10 = able to perform activity at the same level as before injury or problem)  Activity Date: 09/28/23 12/06/23 01/04/24 02/09/24  Sitting too long  5 5 5 3   2. Stamina for regular activities  3 4 5 5   3. Walking for exercise  3 4 3 2   4.       Total Score 3.7 4.3 4.33 3.33    Total Score = Sum of activity scores/number of activities  Minimally Detectable Change: 3 points (for single activity); 2 points (for average score)  [facility] (nd). The Patient Specific Functional Scale . Retrieved from [email or web address]   COGNITION: Overall cognitive status: impulsive, poor safety awareness and insight into deficits      SENSATION: Not tested    LOWER EXTREMITY MMT:  MMT Right eval Left eval 12/25/23 Right 01/04/24 Left 02/09/24  Hip flexion 3 3 4 3  3-  Hip extension       Hip abduction       Hip adduction       Hip internal rotation       Hip external rotation       Knee flexion 5 4 5 4  3+  Knee extension 5 4 5 4  3+  Ankle dorsiflexion 5 5 5 5  3+  Ankle plantarflexion       Ankle inversion       Ankle eversion        (Blank rows = not tested)    FUNCTIONAL TESTS:   3 minute walk test: 576ft no device, intermittently unsteady and reaching for walls, intermittent MinA  Dynamic Gait Index:     09/28/23 0001  Standardized Balance Assessment  Standardized Balance Assessment Dynamic Gait Index  Dynamic Gait Index  Level Surface 2  Change in Gait Speed 2  Gait with Horizontal Head Turns 1  Gait with Vertical Head Turns 0  Gait and Pivot Turn 2  Step Over Obstacle 2  Step Around Obstacles 1  Steps 2  Total Score 12       GAIT: Distance walked: 550ft  Assistive device utilized: None Level of assistance: Min A Comments: unsteady in general without device  TREATMENT DATE:   02/29/24 Unilateral treadmill walking, did not tolerate with LLE on belt,  consider in future sessions -Slouch overcorrect x20 in sitting -Seated W stretch with physioball x12 cycles -lumbar extension in standing x10 -tandem balance in corner 2x30s, dec tolerance to LLE behind with instability noted at 23 sec-pt understands to complete with assistance  02/15/24 Gait training with SPC on level surface, cues for sequencing and step through pattern Seated knee flexion extension with physioball 2x15 BLE  Seated PNF D1 LE pattern 3x10 BLE alternating between sets 3 way hip in standing x10 BLE  Lateral step down x10 BLE from foam pad  Seated hamstring stretch x30 in sitting     02/09/24: Reassessment for POC: DGI , MMT B knee ext 5# 10 reps B knee flexion 25# 10 reps Sit to stand from hi/low mat 5 x 2 with forward punches with 2 # mediball Nustep L 5 x 6 min  Shortened treatment session, stopped 10 min early as pt was concerned that her daughter, who dropped her off would be late for work.   01/04/24 NuStep L 4 x 6 min increase time needed due to pt stopping multiple times  Goals   Careful on the steps   Walking on Clean floors  Sit to  stands 2x5 w/ UE use  LAQ 3lb 2x10  12/16/23 NuStep L4x64mins LE only  LAQ 2# 2x10 Taps on 4 CGA  STS 2x5 elevated mat no hands  Seated row 2x10 Standing ball toss   12/13/23 Nustep level 3 x 5 minutes Seated pball stretch Standing with walker marches, hip abduction CGA STM to the upper traps dn the upper buttocks 15# HS curls LAQ no weight Seated red tband row 5# standing straight arm pulls but very uncoordinated requiring CGA Red tband hip adduction and abduction single legs REd tband ankle exercises poor control Foot on dyna disc heel to toe, poor control HHA side stepping, fwd and backward walking  12/06/23 NuStep L2 x12mins  Seated stretch with pball x10 Seated clamshell red 2x10 Ball squeeze 2x10 LAQ 2x10   12/02/23 STM to the upper traps, neck and rhomboid Passive stretching to HS and K2C. Difficult time relaxing an allow passive motions [employer] b/n knees squeeze  11/29/23 STM to the upper traps, neck and rhomboids Feet on ball K2C, rotation, small bridge, isometric abs very poor control and tolerance reports a lot of pain Ball b/n knees squeeze Yellow tband clamshells Marches LAQ Yellow tband HS curls Toe taps  11/04/23 STS x5- needs cues to come down slow and controlled, tends to plop NuStep L3 x1mins  LAQ x10 HS curls red x10 Ball squeeze 2x10 Attempted some STM but very jumpy and has increased pain with just light touch  10/14/23 Per subjective 10/14/23:   [name] at [facility]  10/08/23 (chair broke and fell straight back), hit head and back, arms shoulders. Urgent care did imaging and found no fractures. Saw neurologist and they told us  to come back to PT to see what she can and can't do. Going back to neurologist on the 14th, this doctor wanted to see what she can and can't do. Needs a lot more help getting dressed, limping a lot.    Attempted nustep on lowest resistance settings but pt was unable to tolerate and did not want to  continue with visit.   Assisted pt and daughter in ambulating out to the car, had our front desk cancel all PT appts up to next  neurologist appt. Will await neurologist assessment/reccs and if appropriate will try PT again at that time.   No charge for today's visit 09/30/23  Nustep L5x8 minutes all four extremities, seat 10  Bridge + ABD into green TB x10 Sidelying clams green TB x10 B Seated LAQs green TB x10 B  Tandem stance solid surface 2x30 seconds B Side steps in // bars x4 laps solid surface  Alternating toe taps 4 inch box x20 solid surface  Forward step ups onto blue foam pad x10 B Lateral step ups onto blue foam pad x10 B  09/28/23  Eval, POC, HEP and education as below    Nustep L5 x6 minutes BLEs only seat 10  PATIENT EDUCATION:  Education details: exam, POC, HEP, education about fall risk and recovery post CVA, use RW ideally to reduce fall risk  Person educated: Patient and Child(ren) Education method: Explanation, Demonstration, and Handouts Education comprehension: verbalized understanding, returned demonstration, and needs further education  HOME EXERCISE PROGRAM:  Access Code: [email or web address] URL: [email or web address] Date: 02/29/2024 Prepared by: Stann Ohara  Exercises - Supine Bridge with Resistance Band  - 1 x daily - 7 x weekly - 1-2 sets - 10 reps - 2 seconds  hold - Clamshell with Resistance  - 1 x daily - 7 x weekly - 1-2 sets - 10 reps - 1 seconds  hold - Seated Knee Extension with Anchored Resistance  - 1 x daily - 7 x weekly - 1-2 sets - 10 reps - 2 seconds  hold - Tandem Stance in Corner  - 1 x daily - 7 x weekly - 1-2 sets - 2 reps - 10 hold - Standing Lumbar Extension at Wall - Forearms  - 1 x daily - 7 x weekly - 1 sets - 10 reps - 2 hold  ASSESSMENT:  CLINICAL IMPRESSION:   Pt tolerated session well, limited by low back pain which has been bothering her since her fall in August, worse today. Able to demonstrate a reduction in  discomfort with lumbar extension principles in standing. Reviewed HEP and pt requires cues and repeat practice. Pt stands to benefit from continued skilled physical therapy to address deficit areas and restore safety with activities and participations at home and in the community.      EVAL: Patient is a 83 y.o. F who was seen today for physical therapy evaluation and treatment for I63.9 (ICD-10-CM) - Cerebrovascular accident (CVA), unspecified mechanism (HCC. Very high fall risk, required a lot of education and cues for safety. Daughter present for eval and very supportive, will be doing pool walking and exercise with her. Anticipate with family support that she will improve with skilled PT services.    OBJECTIVE IMPAIRMENTS: Abnormal gait, decreased activity tolerance, decreased balance, decreased cognition, decreased coordination, decreased mobility, difficulty walking, decreased strength, and decreased safety awareness.   ACTIVITY LIMITATIONS: sitting, standing, squatting, transfers, and locomotion level  PARTICIPATION LIMITATIONS: meal prep, cleaning, driving, shopping, community activity, and yard work  PERSONAL FACTORS: Age, Behavior pattern, Education, Fitness, Past/current experiences, and Time since onset of injury/illness/exacerbation are also affecting patient's functional outcome.   REHAB POTENTIAL: Good  CLINICAL DECISION MAKING: Stable/uncomplicated  EVALUATION COMPLEXITY: Low   GOALS: Goals reviewed with patient? No  SHORT TERM GOALS: Target date: 10/26/2023   Will be compliant with appropriate progressive HEP  Baseline: Goal status: met 12/13/23  2.  Will be able to name 3 ways to reduce fall risk at home and in the community  Baseline:  Goal  status: needs more education on fall risk and maybe using something more stable than cane 11/29/23,  Progressing can name 2 ways 01/04/24  3.  Concerns regarding L LE burning and strength to have resolved by at least  75% Baseline:  Goal status: no improvement 11/29/23, on going 01/04/24  4.  Will be compliant with use of appropriate LRAD to reduce fall risk  Baseline:  Goal status: met 12/13/23    LONG TERM GOALS: Target date: 04/05/24, additional 8 weeks as of 02/09/24    MMT to be 5/5 in all tested groups  Baseline:  Goal status: ongoing 01/04/24  2.  Will score at least 18 on DGI to show reduced fall risk  Baseline:  Goal status: 02/09/24: 9, declined since initial eval  3.  Will have been able to return to walking for exercise with family as desired  Baseline:  Goal status: progressing 12/06/23, ongoing 01/04/24  4.  PSFS to have improved by at least 2 points  Baseline:  Goal status: progressing 12/06/23, Ongoing 01/04/24     PLAN:  PT FREQUENCY: 2x/week  PT DURATION: 8 weeks  PLANNED INTERVENTIONS: 97750- Physical Performance Testing, 97110-Therapeutic exercises, 97530- Therapeutic activity, 97112- Neuromuscular re-education, 97535- Self Care, [id number]- Manual therapy, [id number]- Gait training, and [phone]- Aquatic Therapy  PLAN FOR NEXT SESSION: focus on balance and safety with functional mobility, strength and gait. Work on attention and safety awareness in general, slow her down and work on sequencing and coordination. ESTIM, slow movements as tolerated   Stann Ohara PT, DPT, CLT, CES 02/29/2024 5:03 PM  "

## 2024-03-14 ENCOUNTER — Encounter: Payer: Self-pay | Admitting: Physical Therapy

## 2024-03-14 ENCOUNTER — Ambulatory Visit: Attending: Internal Medicine | Admitting: Physical Therapy

## 2024-03-14 DIAGNOSIS — R2681 Unsteadiness on feet: Secondary | ICD-10-CM | POA: Insufficient documentation

## 2024-03-14 DIAGNOSIS — M6281 Muscle weakness (generalized): Secondary | ICD-10-CM | POA: Diagnosis present

## 2024-03-14 DIAGNOSIS — I639 Cerebral infarction, unspecified: Secondary | ICD-10-CM | POA: Diagnosis present

## 2024-03-14 DIAGNOSIS — R262 Difficulty in walking, not elsewhere classified: Secondary | ICD-10-CM | POA: Insufficient documentation

## 2024-03-14 NOTE — Therapy (Signed)
 " OUTPATIENT PHYSICAL THERAPY LOWER EXTREMITY TREATMENT    Patient Name: Courtney Holmes MRN: [id number] DOB:02/16/1941, 84 y.o., female Today's Date: 03/14/2024  END OF SESSION:  PT End of Session - 03/14/24 1742     Visit Number 13    Date for Recertification  04/05/24    Authorization Type [employer] MCR    PT Start Time 1742    PT Stop Time 1825    PT Time Calculation (min) 43 min    Activity Tolerance Patient tolerated treatment well    Behavior During Therapy Impulsive;WFL for tasks assessed/performed                 Past Medical History:  Diagnosis Date   Hypertension    Stroke (cerebrum) [facility])    Past Surgical History:  Procedure Laterality Date   TUBAL LIGATION     Patient Active Problem List   Diagnosis Date Noted   Acute CVA (cerebrovascular accident) (HCC) 09/09/2023   Left shoulder pain 09/09/2023   Knee joint pain 09/09/2023   Chronic pain syndrome 09/09/2023    PCP: Joshua Francisco MD   REFERRING PROVIDER: Patsy Lenis, MD  REFERRING DIAG: I63.9 (ICD-10-CM) - Cerebrovascular accident (CVA), unspecified mechanism (HCC  THERAPY DIAG:  Unsteadiness on feet  Difficulty in walking, not elsewhere classified  Muscle weakness (generalized)  Acute CVA (cerebrovascular accident) [facility])  Rationale for Evaluation and Treatment: Rehabilitation  ONSET DATE: CVA found on imaging 09/09/23  SUBJECTIVE:   SUBJECTIVE STATEMENT:  Pt reports that she still is really struggling with the back pain, she also reports being off balance and having difficulty walking and reports that she seems to list to the right, also reports that she is dropping things with the left  EVAL: Still having a lot of fatigue since the stroke. L leg is burning more after the stroke, worse in the mornings but staying about the same. Yesterday was a better day. I was getting the left shoulder looked at before the CVA, it still hurts. Having a hard time sitting for a long period of time due to  LE burning. Moving LE helps burning.   PERTINENT HISTORY: See above  PAIN:  Are you having pain? Yes: NPRS scale: 6/10 Pain location: back Pain description: burning Aggravating factors: sitting for a long time  Relieving factors: movement   PRECAUTIONS: Fall  RED FLAGS: None   WEIGHT BEARING RESTRICTIONS: No  FALLS:  Has patient fallen in last 6 months? 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  DIAGNOSTIC FINDINGS:   CLINICAL DATA:  144754 Shoulder pain [id number]   EXAM: LEFT SHOULDER - 2+ VIEW   COMPARISON:  October 26, 2014   FINDINGS: Osteopenia.No acute fracture or dislocation. Moderate AC joint space loss, unchanged. Apparent cardiomegaly.   IMPRESSION: 1. Osteopenia.  No acute fracture or dislocation. 2. Moderate osteoarthritis of the AC joint.  CLINICAL DATA:  Knot on the knee, uncontrollable twitching and moving   EXAM: LEFT KNEE - 1-2 VIEW   COMPARISON:  None Available.   FINDINGS: Osteopenia.No acute fracture or dislocation. Trace joint effusion. Moderate tricompartmental joint space loss with osteophyte formation. Quadriceps insertion enthesophyte. Soft tissues are unremarkable.   IMPRESSION: 1. Trace joint effusion.  No acute fracture or dislocation. 2. Moderate tricompartmental osteoarthritis of the knee.  CLINICAL DATA:  Transient ischemic attack (TIA) Left arm / leg weakness - intermittent x 4 days   EXAM: MRI  HEAD WITHOUT CONTRAST   TECHNIQUE: Multiplanar, multiecho pulse sequences of the brain and surrounding structures were obtained without intravenous contrast.   COMPARISON:  CT head 02/28/2023.   FINDINGS: Brain: Small acute or early subacute infarct in the right basal ganglia (series 5, image 31). Mild associated edema without mass effect. No midline shift, acute hemorrhage, mass lesion or hydrocephalus.   Vascular: Major arterial flow voids are maintained at the skull base.   Skull and upper cervical spine: Normal marrow signal.   Sinuses/Orbits: Mild paranasal sinus mucosal thickening. No acute orbital findings.   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  PATIENT SURVEYS:  PSFS: THE PATIENT SPECIFIC FUNCTIONAL SCALE  Place score of 0-10 (0 = unable to perform activity and 10 = able to perform activity at the same level as before injury or problem)  Activity Date: 09/28/23 12/06/23 01/04/24 02/09/24  Sitting too long  5 5 5 3   2. Stamina for regular activities  3 4 5 5   3. Walking for exercise  3 4 3 2   4.       Total Score 3.7 4.3 4.33 3.33    Total Score = Sum of activity scores/number of activities  Minimally Detectable Change: 3 points (for single activity); 2 points (for average score)  [facility] (nd). The Patient Specific Functional Scale . Retrieved from [email or web address]   COGNITION: Overall cognitive status: impulsive, poor safety awareness and insight into deficits      SENSATION: Not tested    LOWER EXTREMITY MMT:  MMT Right eval Left eval 12/25/23 Right 01/04/24 Left 02/09/24  Hip flexion 3 3 4 3  3-  Hip extension       Hip abduction       Hip adduction       Hip internal rotation       Hip external rotation       Knee flexion 5 4 5 4  3+  Knee extension 5 4 5 4  3+  Ankle dorsiflexion 5 5 5 5  3+  Ankle plantarflexion       Ankle  inversion       Ankle eversion        (Blank rows = not tested)    FUNCTIONAL TESTS:  3 minute walk test: 592ft no device, intermittently unsteady and reaching for walls, intermittent MinA  Dynamic Gait Index:     09/28/23 0001  Standardized Balance Assessment  Standardized Balance Assessment Dynamic Gait Index  Dynamic Gait Index  Level Surface 2  Change in Gait Speed 2  Gait with Horizontal Head Turns 1  Gait with Vertical Head Turns 0  Gait and Pivot Turn 2  Step Over Obstacle 2  Step Around Obstacles 1  Steps 2  Total Score 12       GAIT: Distance walked: 536ft  Assistive device utilized: None Level of assistance: Min A Comments: unsteady in general without device  TREATMENT DATE:  03/15/23 Nustep level 4 x 6 minutes Leg curls 20# On airex cone toe touches On airex ball toss 2 blocks more square step overs Partial sit ups Walking fast pace 2 laps Walking ball toss Sit to stand on airex with weighted ball   02/29/24 Unilateral treadmill walking, did not tolerate with LLE on belt,  consider in future sessions -Slouch overcorrect x20 in sitting -Seated W stretch with physioball x12 cycles -lumbar extension in standing x10 -tandem balance in corner 2x30s, dec tolerance to LLE behind with instability noted at 23 sec-pt understands to complete with assistance  02/15/24 Gait training with SPC on level surface, cues for sequencing and step through pattern Seated knee flexion extension with physioball 2x15 BLE  Seated PNF D1 LE pattern 3x10 BLE alternating between sets 3 way hip in standing x10 BLE  Lateral step down x10 BLE from foam pad  Seated hamstring stretch x30 in sitting     02/09/24: Reassessment for POC: DGI , MMT B knee ext 5# 10 reps B knee flexion 25# 10 reps Sit to stand from hi/low mat 5 x 2 with forward punches  with 2 # mediball Nustep L 5 x 6 min  Shortened treatment session, stopped 10 min early as pt was concerned that her daughter, who dropped her off would be late for work.   01/04/24 NuStep L 4 x 6 min increase time needed due to pt stopping multiple times  Goals   Careful on the steps   Walking on Clean floors  Sit to stands 2x5 w/ UE use  LAQ 3lb 2x10  12/16/23 NuStep L4x73mins LE only  LAQ 2# 2x10 Taps on 4 CGA  STS 2x5 elevated mat no hands  Seated row 2x10 Standing ball toss   12/13/23 Nustep level 3 x 5 minutes Seated pball stretch Standing with walker marches, hip abduction CGA STM to the upper traps dn the upper buttocks 15# HS curls LAQ no weight Seated red tband row 5# standing straight arm pulls but very uncoordinated requiring CGA Red tband hip adduction and abduction single legs REd tband ankle exercises poor control Foot on dyna disc heel to toe, poor control HHA side stepping, fwd and backward walking  12/06/23 NuStep L2 x6mins  Seated stretch with pball x10 Seated clamshell red 2x10 Ball squeeze 2x10 LAQ 2x10   12/02/23 STM to the upper traps, neck and rhomboid Passive stretching to HS and K2C. Difficult time relaxing an allow passive motions [employer] b/n knees squeeze  11/29/23 STM to the upper traps, neck and rhomboids Feet on ball K2C, rotation, small bridge, isometric abs very poor control and tolerance reports a lot of pain Ball b/n knees squeeze Yellow tband clamshells Marches LAQ Yellow tband HS curls Toe taps  11/04/23 STS x5- needs cues to come down slow and controlled, tends to plop NuStep L3 x69mins  LAQ x10 HS curls red x10 Ball squeeze 2x10 Attempted some STM but very jumpy and has increased pain with just light touch  10/14/23 Per subjective 10/14/23:   [name] at [facility]  10/08/23 (chair broke and fell straight back), hit head and back, arms shoulders. Urgent care did imaging and found no fractures. Saw  neurologist and they told us  to come back to PT to see what she can and can't do. Going back to neurologist on the 14th, this doctor wanted to see what she can and can't do. Needs a lot more help getting dressed, limping a lot.  Attempted nustep on lowest resistance settings but pt was unable to tolerate and did not want to continue with visit.   Assisted pt and daughter in ambulating out to the car, had our front desk cancel all PT appts up to next neurologist appt. Will await neurologist assessment/reccs and if appropriate will try PT again at that time.   No charge for today's visit 09/30/23  Nustep L5x8 minutes all four extremities, seat 10  Bridge + ABD into green TB x10 Sidelying clams green TB x10 B Seated LAQs green TB x10 B  Tandem stance solid surface 2x30 seconds B Side steps in // bars x4 laps solid surface  Alternating toe taps 4 inch box x20 solid surface  Forward step ups onto blue foam pad x10 B Lateral step ups onto blue foam pad x10 B  09/28/23  Eval, POC, HEP and education as below    Nustep L5 x6 minutes BLEs only seat 10  PATIENT EDUCATION:  Education details: exam, POC, HEP, education about fall risk and recovery post CVA, use RW ideally to reduce fall risk  Person educated: Patient and Child(ren) Education method: Explanation, Demonstration, and Handouts Education comprehension: verbalized understanding, returned demonstration, and needs further education  HOME EXERCISE PROGRAM:  Access Code: [email or web address] URL: [email or web address] Date: 02/29/2024 Prepared by: Stann Ohara  Exercises - Supine Bridge with Resistance Band  - 1 x daily - 7 x weekly - 1-2 sets - 10 reps - 2 seconds  hold - Clamshell with Resistance  - 1 x daily - 7 x weekly - 1-2 sets - 10 reps - 1 seconds  hold - Seated Knee Extension with Anchored Resistance  - 1 x daily - 7 x weekly - 1-2 sets - 10 reps - 2 seconds  hold - Tandem Stance in Corner  - 1 x daily - 7 x  weekly - 1-2 sets - 2 reps - 10 hold - Standing Lumbar Extension at Wall - Forearms  - 1 x daily - 7 x weekly - 1 sets - 10 reps - 2 hold  ASSESSMENT:  CLINICAL IMPRESSION:   Pt limited to some extent by fear of falling, is afraid and resistant at times to higher level balance activities, she is also limited by low back pain which has been bothering her since her fall in August.  I continue to push her balance and walking, she is walking now with [facility], but in the clinic we did not use the [facility]. She does have left hand that shakes uncontrollably at times and it appears that she has a lack of coordination of the left hand with ball toss it was an ataxic left UE pattern.  Pt stands to benefit from continued skilled physical therapy to address deficit areas and restore safety with activities and participations at home and in the community.    EVAL: Patient is a 84 y.o. F who was seen today for physical therapy evaluation and treatment for I63.9 (ICD-10-CM) - Cerebrovascular accident (CVA), unspecified mechanism (HCC. Very high fall risk, required a lot of education and cues for safety. Daughter present for eval and very supportive, will be doing pool walking and exercise with her. Anticipate with family support that she will improve with skilled PT services.    OBJECTIVE IMPAIRMENTS: Abnormal gait, decreased activity tolerance, decreased balance, decreased cognition, decreased coordination, decreased mobility, difficulty walking, decreased strength, and decreased safety awareness.   ACTIVITY LIMITATIONS: sitting, standing, squatting, transfers, and locomotion level  PARTICIPATION LIMITATIONS: meal prep, cleaning, driving,  shopping, community activity, and yard work  PERSONAL FACTORS: Age, Behavior pattern, Education, Fitness, Past/current experiences, and Time since onset of injury/illness/exacerbation are also affecting patient's functional outcome.   REHAB POTENTIAL: Good  CLINICAL DECISION MAKING:  Stable/uncomplicated  EVALUATION COMPLEXITY: Low   GOALS: Goals reviewed with patient? No  SHORT TERM GOALS: Target date: 10/26/2023   Will be compliant with appropriate progressive HEP  Baseline: Goal status: met 12/13/23  2.  Will be able to name 3 ways to reduce fall risk at home and in the community  Baseline:  Goal status: needs more education on fall risk and maybe using something more stable than cane 11/29/23,  Progressing can name 2 ways 01/04/24  3.  Concerns regarding L LE burning and strength to have resolved by at least 75% Baseline:  Goal status: no improvement 11/29/23, on going 01/04/24, met 03/15/23  4.  Will be compliant with use of appropriate LRAD to reduce fall risk  Baseline:  Goal status: met 12/13/23    LONG TERM GOALS: Target date: 04/05/24, additional 8 weeks as of 02/09/24    MMT to be 5/5 in all tested groups  Baseline:  Goal status: ongoing 01/04/24. 03/14/24  2.  Will score at least 18 on DGI to show reduced fall risk  Baseline:  Goal status: 02/09/24: 9, declined since initial eval  3.  Will have been able to return to walking for exercise with family as desired  Baseline:  Goal status: progressing 12/06/23, ongoing 01/04/24, progressing 03/15/23  4.  PSFS to have improved by at least 2 points  Baseline:  Goal status: progressing 12/06/23, Ongoing 01/04/24, progressing 03/15/23     PLAN:  PT FREQUENCY: 2x/week  PT DURATION: 8 weeks  PLANNED INTERVENTIONS: 97750- Physical Performance Testing, 97110-Therapeutic exercises, 97530- Therapeutic activity, 97112- Neuromuscular re-education, 97535- Self Care, [id number]- Manual therapy, [id number]- Gait training, and [phone]- Aquatic Therapy  PLAN FOR NEXT SESSION: focus on balance and safety with functional mobility, strength and gait. Work on attention and safety awareness in general, slow her down and work on sequencing and coordination. ESTIM, slow movements as tolerated   Ozell Mainland, PT 03/14/2024 5:42  PM  "

## 2024-03-16 ENCOUNTER — Ambulatory Visit: Admitting: Physical Therapy

## 2024-03-21 ENCOUNTER — Encounter: Payer: Self-pay | Admitting: Physical Therapy

## 2024-03-21 ENCOUNTER — Ambulatory Visit: Admitting: Physical Therapy

## 2024-03-21 DIAGNOSIS — M6281 Muscle weakness (generalized): Secondary | ICD-10-CM

## 2024-03-21 DIAGNOSIS — R262 Difficulty in walking, not elsewhere classified: Secondary | ICD-10-CM

## 2024-03-21 DIAGNOSIS — R2681 Unsteadiness on feet: Secondary | ICD-10-CM | POA: Diagnosis not present

## 2024-03-21 DIAGNOSIS — I639 Cerebral infarction, unspecified: Secondary | ICD-10-CM

## 2024-03-21 NOTE — Therapy (Signed)
 " OUTPATIENT PHYSICAL THERAPY LOWER EXTREMITY TREATMENT    Patient Name: Courtney Holmes MRN: [id number] DOB:1940/10/11, 84 y.o., female Today's Date: 03/21/2024  END OF SESSION:  PT End of Session - 03/21/24 1719     Visit Number 14    Date for Recertification  04/05/24    Authorization Type [employer] MCR    PT Start Time 1720    PT Stop Time 1805    PT Time Calculation (min) 45 min    Activity Tolerance Patient tolerated treatment well    Behavior During Therapy Impulsive;WFL for tasks assessed/performed                 Past Medical History:  Diagnosis Date   Hypertension    Stroke (cerebrum) [facility])    Past Surgical History:  Procedure Laterality Date   TUBAL LIGATION     Patient Active Problem List   Diagnosis Date Noted   Acute CVA (cerebrovascular accident) (HCC) 09/09/2023   Left shoulder pain 09/09/2023   Knee joint pain 09/09/2023   Chronic pain syndrome 09/09/2023    PCP: Joshua Francisco MD   REFERRING PROVIDER: Patsy Lenis, MD  REFERRING DIAG: I63.9 (ICD-10-CM) - Cerebrovascular accident (CVA), unspecified mechanism (HCC  THERAPY DIAG:  Difficulty in walking, not elsewhere classified  Muscle weakness (generalized)  Unsteadiness on feet  Acute CVA (cerebrovascular accident) [facility])  Rationale for Evaluation and Treatment: Rehabilitation  ONSET DATE: CVA found on imaging 09/09/23  SUBJECTIVE:   SUBJECTIVE STATEMENT:  Patient reports doing okay, was tired and a little sore after the last visit  EVAL: Still having a lot of fatigue since the stroke. L leg is burning more after the stroke, worse in the mornings but staying about the same. Yesterday was a better day. I was getting the left shoulder looked at before the CVA, it still hurts. Having a hard time sitting for a long period of time due to LE burning. Moving LE helps burning.   PERTINENT HISTORY: See above  PAIN:  Are you having pain? Yes: NPRS scale: 6/10 Pain location: back Pain  description: burning Aggravating factors: sitting for a long time  Relieving factors: movement   PRECAUTIONS: Fall  RED FLAGS: None   WEIGHT BEARING RESTRICTIONS: No  FALLS:  Has patient fallen in last 6 months? 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  DIAGNOSTIC FINDINGS:   CLINICAL DATA:  144754 Shoulder pain [id number]   EXAM: LEFT SHOULDER - 2+ VIEW   COMPARISON:  October 26, 2014   FINDINGS: Osteopenia.No acute fracture or dislocation. Moderate AC joint space loss, unchanged. Apparent cardiomegaly.   IMPRESSION: 1. Osteopenia.  No acute fracture or dislocation. 2. Moderate osteoarthritis of the AC joint.  CLINICAL DATA:  Knot on the knee, uncontrollable twitching and moving   EXAM: LEFT KNEE - 1-2 VIEW   COMPARISON:  None Available.   FINDINGS: Osteopenia.No acute fracture or dislocation. Trace joint effusion. Moderate tricompartmental joint space loss with osteophyte formation. Quadriceps insertion enthesophyte. Soft tissues are unremarkable.  IMPRESSION: 1. Trace joint effusion.  No acute fracture or dislocation. 2. Moderate tricompartmental osteoarthritis of the knee.  CLINICAL DATA:  Transient ischemic attack (TIA) Left arm / leg weakness - intermittent x 4 days   EXAM: MRI HEAD WITHOUT CONTRAST   TECHNIQUE: Multiplanar, multiecho pulse sequences of the brain and surrounding structures were obtained without intravenous  contrast.   COMPARISON:  CT head 02/28/2023.   FINDINGS: Brain: Small acute or early subacute infarct in the right basal ganglia (series 5, image 31). Mild associated edema without mass effect. No midline shift, acute hemorrhage, mass lesion or hydrocephalus.   Vascular: Major arterial flow voids are maintained at the skull base.   Skull and upper cervical spine: Normal marrow signal.   Sinuses/Orbits: Mild paranasal sinus mucosal thickening. No acute orbital findings.   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  PATIENT SURVEYS:  PSFS: THE PATIENT SPECIFIC FUNCTIONAL SCALE  Place score of 0-10 (0 = unable to perform activity and 10 = able to perform activity at the same level as before injury or problem)  Activity Date: 09/28/23 12/06/23 01/04/24 02/09/24  Sitting too long  5 5 5 3   2. Stamina for regular activities  3 4 5 5   3. Walking for exercise  3 4 3 2   4.       Total Score 3.7 4.3 4.33 3.33    Total Score = Sum of activity scores/number of activities  Minimally Detectable Change: 3 points (for single activity); 2 points (for average score)  [facility] (nd). The Patient Specific Functional Scale . Retrieved from [email or web address]   COGNITION: Overall cognitive status: impulsive, poor safety awareness and insight into deficits      SENSATION: Not tested    LOWER EXTREMITY MMT:  MMT Right eval Left eval 12/25/23 Right 01/04/24 Left 02/09/24  Hip flexion 3 3 4 3  3-  Hip extension       Hip abduction       Hip adduction       Hip internal rotation       Hip external rotation       Knee flexion 5 4 5 4  3+  Knee extension 5 4 5 4  3+  Ankle dorsiflexion 5 5 5 5  3+  Ankle plantarflexion       Ankle inversion       Ankle eversion        (Blank rows = not tested)    FUNCTIONAL TESTS:  3 minute walk test: 5105ft no device, intermittently unsteady and  reaching for walls, intermittent MinA  Dynamic Gait Index:     09/28/23 0001  Standardized Balance Assessment  Standardized Balance Assessment Dynamic Gait Index  Dynamic Gait Index  Level Surface 2  Change in Gait Speed 2  Gait with Horizontal Head Turns 1  Gait with Vertical Head Turns 0  Gait and Pivot Turn 2  Step Over Obstacle 2  Step Around Obstacles 1  Steps 2  Total Score 12       GAIT: Distance walked: 524ft  Assistive device utilized: None Level of assistance: Min A Comments: unsteady in general without device  TREATMENT DATE:  03/21/24 Bike level 3 x 5 minutes Nustep level 4 x 5 minutes Leg curls 25# LEg extension 5# Partial sit ups Ball toss Ball kicks Feet on ball K2C, rotation, bridge, isometric abs Blue tband clamshells Ball b/n knees squeeze Walk with no device 2 fast laps 3 block more square step overs with CGA  03/15/23 Nustep level 4 x 6 minutes Leg curls 20# On airex cone toe touches On airex ball toss 2 blocks more square step overs Partial sit ups Walking fast pace 2 laps Walking ball toss Sit to stand on airex with weighted ball   02/29/24 Unilateral treadmill walking, did not tolerate with LLE on belt,  consider in future sessions -Slouch overcorrect x20 in sitting -Seated W stretch with physioball x12 cycles -lumbar extension in standing x10 -tandem balance in corner 2x30s, dec tolerance to LLE behind with instability noted at 23 sec-pt understands to complete with assistance  02/15/24 Gait training with SPC on level surface, cues for sequencing and step through pattern Seated knee flexion extension with physioball 2x15 BLE  Seated PNF D1 LE pattern 3x10 BLE alternating between sets 3 way hip in standing x10 BLE  Lateral step down x10 BLE from foam pad  Seated hamstring stretch x30 in sitting      02/09/24: Reassessment for POC: DGI , MMT B knee ext 5# 10 reps B knee flexion 25# 10 reps Sit to stand from hi/low mat 5 x 2 with forward punches with 2 # mediball Nustep L 5 x 6 min  Shortened treatment session, stopped 10 min early as pt was concerned that her daughter, who dropped her off would be late for work.   01/04/24 NuStep L 4 x 6 min increase time needed due to pt stopping multiple times  Goals   Careful on the steps   Walking on Clean floors  Sit to stands 2x5 w/ UE use  LAQ 3lb 2x10  12/16/23 NuStep L4x77mins LE only  LAQ 2# 2x10 Taps on 4 CGA  STS 2x5 elevated mat no hands  Seated row 2x10 Standing ball toss   12/13/23 Nustep level 3 x 5 minutes Seated pball stretch Standing with walker marches, hip abduction CGA STM to the upper traps dn the upper buttocks 15# HS curls LAQ no weight Seated red tband row 5# standing straight arm pulls but very uncoordinated requiring CGA Red tband hip adduction and abduction single legs REd tband ankle exercises poor control Foot on dyna disc heel to toe, poor control HHA side stepping, fwd and backward walking  12/06/23 NuStep L2 x19mins  Seated stretch with pball x10 Seated clamshell red 2x10 Ball squeeze 2x10 LAQ 2x10   12/02/23 STM to the upper traps, neck and rhomboid Passive stretching to HS and K2C. Difficult time relaxing an allow passive motions [employer] b/n knees squeeze  11/29/23 STM to the upper traps, neck and rhomboids Feet on ball K2C, rotation, small bridge, isometric abs very poor control and tolerance reports a lot of pain Ball b/n knees squeeze Yellow tband clamshells Marches LAQ Yellow tband HS curls Toe taps  11/04/23 STS x5- needs cues to come down slow and controlled, tends to plop NuStep L3 x24mins  LAQ x10 HS curls red x10 Ball squeeze 2x10 Attempted some STM but very jumpy and has increased pain with just light touch  10/14/23 Per subjective 10/14/23:   [name] at  [facility]  10/08/23 (chair broke and fell straight back), hit head and back, arms  shoulders. Urgent care did imaging and found no fractures. Saw neurologist and they told us  to come back to PT to see what she can and can't do. Going back to neurologist on the 14th, this doctor wanted to see what she can and can't do. Needs a lot more help getting dressed, limping a lot.    Attempted nustep on lowest resistance settings but pt was unable to tolerate and did not want to continue with visit.   Assisted pt and daughter in ambulating out to the car, had our front desk cancel all PT appts up to next neurologist appt. Will await neurologist assessment/reccs and if appropriate will try PT again at that time.   No charge for today's visit 09/30/23  Nustep L5x8 minutes all four extremities, seat 10  Bridge + ABD into green TB x10 Sidelying clams green TB x10 B Seated LAQs green TB x10 B  Tandem stance solid surface 2x30 seconds B Side steps in // bars x4 laps solid surface  Alternating toe taps 4 inch box x20 solid surface  Forward step ups onto blue foam pad x10 B Lateral step ups onto blue foam pad x10 B  09/28/23  Eval, POC, HEP and education as below    Nustep L5 x6 minutes BLEs only seat 10  PATIENT EDUCATION:  Education details: exam, POC, HEP, education about fall risk and recovery post CVA, use RW ideally to reduce fall risk  Person educated: Patient and Child(ren) Education method: Explanation, Demonstration, and Handouts Education comprehension: verbalized understanding, returned demonstration, and needs further education  HOME EXERCISE PROGRAM:  Access Code: [email or web address] URL: [email or web address] Date: 02/29/2024 Prepared by: Stann Ohara  Exercises - Supine Bridge with Resistance Band  - 1 x daily - 7 x weekly - 1-2 sets - 10 reps - 2 seconds  hold - Clamshell with Resistance  - 1 x daily - 7 x weekly - 1-2 sets - 10 reps - 1 seconds  hold -  Seated Knee Extension with Anchored Resistance  - 1 x daily - 7 x weekly - 1-2 sets - 10 reps - 2 seconds  hold - Tandem Stance in Corner  - 1 x daily - 7 x weekly - 1-2 sets - 2 reps - 10 hold - Standing Lumbar Extension at Wall - Forearms  - 1 x daily - 7 x weekly - 1 sets - 10 reps - 2 hold  ASSESSMENT:  CLINICAL IMPRESSION:  Patient doing better overall, still at times has uncontrolled motions of the left UE and the left LE.  She is still having back pain with partial sit ups and with some motions, asked her to try to do some pelvic tilts and rocks throughout the day.  She is walking with a SPC for most walks but in the clinic we do not use anything. Pt limited to some extent by fear of falling, is afraid and resistant at times to higher level balance activities, she is also limited by low back pain which has been bothering her since her fall in August.  I continue to push her balance and walking, she is walking now with [facility], but in the clinic we did not use the [facility]. She does have left hand that shakes uncontrollably at times and it appears that she has a lack of coordination of the left hand with ball toss it was an ataxic left UE pattern.  Pt stands to benefit from continued skilled physical therapy to address deficit areas and restore safety with  activities and participations at home and in the community.    EVAL: Patient is a 84 y.o. F who was seen today for physical therapy evaluation and treatment for I63.9 (ICD-10-CM) - Cerebrovascular accident (CVA), unspecified mechanism (HCC. Very high fall risk, required a lot of education and cues for safety. Daughter present for eval and very supportive, will be doing pool walking and exercise with her. Anticipate with family support that she will improve with skilled PT services.    OBJECTIVE IMPAIRMENTS: Abnormal gait, decreased activity tolerance, decreased balance, decreased cognition, decreased coordination, decreased mobility, difficulty walking,  decreased strength, and decreased safety awareness.   ACTIVITY LIMITATIONS: sitting, standing, squatting, transfers, and locomotion level  PARTICIPATION LIMITATIONS: meal prep, cleaning, driving, shopping, community activity, and yard work  PERSONAL FACTORS: Age, Behavior pattern, Education, Fitness, Past/current experiences, and Time since onset of injury/illness/exacerbation are also affecting patient's functional outcome.   REHAB POTENTIAL: Good  CLINICAL DECISION MAKING: Stable/uncomplicated  EVALUATION COMPLEXITY: Low   GOALS: Goals reviewed with patient? No  SHORT TERM GOALS: Target date: 10/26/2023   Will be compliant with appropriate progressive HEP  Baseline: Goal status: met 12/13/23  2.  Will be able to name 3 ways to reduce fall risk at home and in the community  Baseline:  Goal status: needs more education on fall risk and maybe using something more stable than cane 11/29/23,  Progressing can name 2 ways 01/04/24  3.  Concerns regarding L LE burning and strength to have resolved by at least 75% Baseline:  Goal status: no improvement 11/29/23, on going 01/04/24, met 03/15/23  4.  Will be compliant with use of appropriate LRAD to reduce fall risk  Baseline:  Goal status: met 12/13/23    LONG TERM GOALS: Target date: 04/05/24, additional 8 weeks as of 02/09/24    MMT to be 5/5 in all tested groups  Baseline:  Goal status: ongoing 01/04/24. 03/14/24  2.  Will score at least 18 on DGI to show reduced fall risk  Baseline:  Goal status: 02/09/24: 9, declined since initial eval  3.  Will have been able to return to walking for exercise with family as desired  Baseline:  Goal status: progressing 12/06/23, ongoing 01/04/24, progressing 03/15/23  4.  PSFS to have improved by at least 2 points  Baseline:  Goal status: progressing 12/06/23, Ongoing 01/04/24, progressing 03/15/23     PLAN:  PT FREQUENCY: 2x/week  PT DURATION: 8 weeks  PLANNED INTERVENTIONS: [id number]-  Physical Performance Testing, 97110-Therapeutic exercises, 97530- Therapeutic activity, 97112- Neuromuscular re-education, 97535- Self Care, [id number]- Manual therapy, [id number]- Gait training, and [phone]- Aquatic Therapy  PLAN FOR NEXT SESSION: focus on balance and safety with functional mobility, strength and gait. Work on attention and safety awareness in general, slow her down and work on sequencing and coordination. ESTIM, slow movements as tolerated   Ozell Mainland, PT 03/21/2024 5:20 PM  "

## 2024-03-28 ENCOUNTER — Ambulatory Visit

## 2024-03-28 DIAGNOSIS — I639 Cerebral infarction, unspecified: Secondary | ICD-10-CM

## 2024-03-28 DIAGNOSIS — M6281 Muscle weakness (generalized): Secondary | ICD-10-CM

## 2024-03-28 DIAGNOSIS — R2681 Unsteadiness on feet: Secondary | ICD-10-CM | POA: Diagnosis not present

## 2024-03-28 DIAGNOSIS — R262 Difficulty in walking, not elsewhere classified: Secondary | ICD-10-CM

## 2024-03-28 NOTE — Therapy (Signed)
 " OUTPATIENT PHYSICAL THERAPY LOWER EXTREMITY TREATMENT    Patient Name: Courtney Holmes MRN: [id number] DOB:May 18, 1940, 84 y.o., female Today's Date: 03/28/2024  END OF SESSION:  PT End of Session - 03/28/24 1830     Visit Number 15    Date for Recertification  04/05/24    Authorization Type [employer] MCR    PT Start Time 1715    PT Stop Time 1757    PT Time Calculation (min) 42 min    Activity Tolerance Patient tolerated treatment well    Behavior During Therapy WFL for tasks assessed/performed             Past Medical History:  Diagnosis Date   Hypertension    Stroke (cerebrum) [facility])    Past Surgical History:  Procedure Laterality Date   TUBAL LIGATION     Patient Active Problem List   Diagnosis Date Noted   Acute CVA (cerebrovascular accident) (HCC) 09/09/2023   Left shoulder pain 09/09/2023   Knee joint pain 09/09/2023   Chronic pain syndrome 09/09/2023    PCP: Joshua Francisco MD   REFERRING PROVIDER: Patsy Lenis, MD  REFERRING DIAG: I63.9 (ICD-10-CM) - Cerebrovascular accident (CVA), unspecified mechanism (HCC  THERAPY DIAG:  Acute CVA (cerebrovascular accident) (HCC)  Difficulty in walking, not elsewhere classified  Muscle weakness (generalized)  Unsteadiness on feet  Rationale for Evaluation and Treatment: Rehabilitation  ONSET DATE: CVA found on imaging 09/09/23  SUBJECTIVE:   SUBJECTIVE STATEMENT:  Patient reports she is doing okay today with no need to complain. Pt reports general soreness in major joints but it is not stopping her ADLs.   EVAL: Still having a lot of fatigue since the stroke. L leg is burning more after the stroke, worse in the mornings but staying about the same. Yesterday was a better day. I was getting the left shoulder looked at before the CVA, it still hurts. Having a hard time sitting for a long period of time due to LE burning. Moving LE helps burning.   PERTINENT HISTORY: See above  PAIN:  Are you having pain? Yes:  NPRS scale: 6/10 Pain location: back Pain description: burning Aggravating factors: sitting for a long time  Relieving factors: movement   PRECAUTIONS: Fall  RED FLAGS: None   WEIGHT BEARING RESTRICTIONS: No  FALLS:  Has patient fallen in last 6 months? 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  DIAGNOSTIC FINDINGS:   CLINICAL DATA:  [id number] Shoulder pain [id number]   EXAM: LEFT SHOULDER - 2+ VIEW   COMPARISON:  October 26, 2014   FINDINGS: Osteopenia.No acute fracture or dislocation. Moderate AC joint space loss, unchanged. Apparent cardiomegaly.   IMPRESSION: 1. Osteopenia.  No acute fracture or dislocation. 2. Moderate osteoarthritis of the AC joint.  CLINICAL DATA:  Knot on the knee, uncontrollable twitching and moving   EXAM: LEFT KNEE - 1-2 VIEW   COMPARISON:  None Available.   FINDINGS: Osteopenia.No acute fracture or dislocation. Trace joint effusion. Moderate tricompartmental joint space loss with  osteophyte formation. Quadriceps insertion enthesophyte. Soft tissues are unremarkable.   IMPRESSION: 1. Trace joint effusion.  No acute fracture or dislocation. 2. Moderate tricompartmental osteoarthritis of the knee.  CLINICAL DATA:  Transient ischemic attack (TIA) Left arm / leg weakness - intermittent x 4 days   EXAM: MRI HEAD WITHOUT CONTRAST   TECHNIQUE: Multiplanar, multiecho pulse sequences of the brain and  surrounding structures were obtained without intravenous contrast.   COMPARISON:  CT head 02/28/2023.   FINDINGS: Brain: Small acute or early subacute infarct in the right basal ganglia (series 5, image 31). Mild associated edema without mass effect. No midline shift, acute hemorrhage, mass lesion or hydrocephalus.   Vascular: Major arterial flow voids are maintained at the skull base.   Skull and upper cervical spine: Normal marrow signal.   Sinuses/Orbits: Mild paranasal sinus mucosal thickening. No acute orbital findings.   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  PATIENT SURVEYS:  PSFS: THE PATIENT SPECIFIC FUNCTIONAL SCALE  Place score of 0-10 (0 = unable to perform activity and 10 = able to perform activity at the same level as before injury or problem)  Activity Date: 09/28/23 12/06/23 01/04/24 02/09/24  Sitting too long  5 5 5 3   2. Stamina for regular activities  3 4 5 5   3. Walking for exercise  3 4 3 2   4.       Total Score 3.7 4.3 4.33 3.33    Total Score = Sum of activity scores/number of activities  Minimally Detectable Change: 3 points (for single activity); 2 points (for average score)  [facility] (nd). The Patient Specific Functional Scale . Retrieved from [email or web address]   COGNITION: Overall cognitive status: impulsive, poor safety awareness and insight into deficits      SENSATION: Not tested    LOWER EXTREMITY MMT:  MMT Right eval Left eval 12/25/23 Right 01/04/24 Left 02/09/24  Hip flexion 3 3 4 3  3-  Hip extension       Hip abduction       Hip adduction       Hip internal rotation       Hip external rotation       Knee flexion 5 4 5 4  3+  Knee extension 5 4 5 4  3+  Ankle dorsiflexion 5 5 5 5  3+  Ankle plantarflexion       Ankle inversion       Ankle eversion        (Blank rows = not tested)    FUNCTIONAL TESTS:  3 minute  walk test: 51ft no device, intermittently unsteady and reaching for walls, intermittent MinA  Dynamic Gait Index:     09/28/23 0001  Standardized Balance Assessment  Standardized Balance Assessment Dynamic Gait Index  Dynamic Gait Index  Level Surface 2  Change in Gait Speed 2  Gait with Horizontal Head Turns 1  Gait with Vertical Head Turns 0  Gait and Pivot Turn 2  Step Over Obstacle 2  Step Around Obstacles 1  Steps 2  Total Score 12       GAIT: Distance walked: 568ft  Assistive device utilized: None Level of assistance: Min A Comments: unsteady in general without device  TREATMENT DATE:  03/28/24 Nustep L5 x  Reassess DGI Toe Taps 6 no UE use Step Ups 6 1 railing use  STS w/ chest press yellow ball 4# Knee ext 2.5# 2x10 HS Curl 2x10 Blue Tband   03/21/24 Bike level 3 x 5 minutes Nustep level 4 x 5 minutes Leg curls 25# LEg extension 5# Partial sit ups Ball toss Ball kicks Feet on ball K2C, rotation, bridge, isometric abs Blue tband clamshells Ball b/n knees squeeze Walk with no device 2 fast laps 3 block more square step overs with CGA  03/15/23 Nustep level 4 x 6 minutes Leg curls 20# On airex cone toe touches On airex ball toss 2 blocks more square step overs Partial sit ups Walking fast pace 2 laps Walking ball toss Sit to stand on airex with weighted ball   02/29/24 Unilateral treadmill walking, did not tolerate with LLE on belt,  consider in future sessions -Slouch overcorrect x20 in sitting -Seated W stretch with physioball x12 cycles -lumbar extension in standing x10 -tandem balance in corner 2x30s, dec tolerance to LLE behind with instability noted at 23 sec-pt understands to complete with assistance  02/15/24 Gait training with SPC on level surface, cues for sequencing and step through pattern Seated  knee flexion extension with physioball 2x15 BLE  Seated PNF D1 LE pattern 3x10 BLE alternating between sets 3 way hip in standing x10 BLE  Lateral step down x10 BLE from foam pad  Seated hamstring stretch x30 in sitting     02/09/24: Reassessment for POC: DGI , MMT B knee ext 5# 10 reps B knee flexion 25# 10 reps Sit to stand from hi/low mat 5 x 2 with forward punches with 2 # mediball Nustep L 5 x 6 min  Shortened treatment session, stopped 10 min early as pt was concerned that her daughter, who dropped her off would be late for work.   01/04/24 NuStep L 4 x 6 min increase time needed due to pt stopping multiple times  Goals   Careful on the steps   Walking on Clean floors  Sit to stands 2x5 w/ UE use  LAQ 3lb 2x10  12/16/23 NuStep L4x54mins LE only  LAQ 2# 2x10 Taps on 4 CGA  STS 2x5 elevated mat no hands  Seated row 2x10 Standing ball toss   12/13/23 Nustep level 3 x 5 minutes Seated pball stretch Standing with walker marches, hip abduction CGA STM to the upper traps dn the upper buttocks 15# HS curls LAQ no weight Seated red tband row 5# standing straight arm pulls but very uncoordinated requiring CGA Red tband hip adduction and abduction single legs REd tband ankle exercises poor control Foot on dyna disc heel to toe, poor control HHA side stepping, fwd and backward walking  12/06/23 NuStep L2 x89mins  Seated stretch with pball x10 Seated clamshell red 2x10 Ball squeeze 2x10 LAQ 2x10   12/02/23 STM to the upper traps, neck and rhomboid Passive stretching to HS and K2C. Difficult time relaxing an allow passive motions [employer] b/n knees squeeze  11/29/23 STM to the upper traps, neck and rhomboids Feet on ball K2C, rotation, small bridge, isometric abs very poor control and tolerance reports a lot of pain Ball b/n knees squeeze Yellow tband clamshells Marches LAQ Yellow tband HS curls Toe taps  11/04/23 STS x5- needs cues to come down slow  and controlled, tends to plop NuStep L3 x76mins  LAQ x10 HS curls red x10 Ball squeeze 2x10  Attempted some STM but very jumpy and has increased pain with just light touch  10/14/23 Per subjective 10/14/23:   [name] at [facility]  10/08/23 (chair broke and fell straight back), hit head and back, arms shoulders. Urgent care did imaging and found no fractures. Saw neurologist and they told us  to come back to PT to see what she can and can't do. Going back to neurologist on the 14th, this doctor wanted to see what she can and can't do. Needs a lot more help getting dressed, limping a lot.    Attempted nustep on lowest resistance settings but pt was unable to tolerate and did not want to continue with visit.   Assisted pt and daughter in ambulating out to the car, had our front desk cancel all PT appts up to next neurologist appt. Will await neurologist assessment/reccs and if appropriate will try PT again at that time.   No charge for today's visit 09/30/23  Nustep L5x8 minutes all four extremities, seat 10  Bridge + ABD into green TB x10 Sidelying clams green TB x10 B Seated LAQs green TB x10 B  Tandem stance solid surface 2x30 seconds B Side steps in // bars x4 laps solid surface  Alternating toe taps 4 inch box x20 solid surface  Forward step ups onto blue foam pad x10 B Lateral step ups onto blue foam pad x10 B  09/28/23  Eval, POC, HEP and education as below    Nustep L5 x6 minutes BLEs only seat 10  PATIENT EDUCATION:  Education details: exam, POC, HEP, education about fall risk and recovery post CVA, use RW ideally to reduce fall risk  Person educated: Patient and Child(ren) Education method: Explanation, Demonstration, and Handouts Education comprehension: verbalized understanding, returned demonstration, and needs further education  HOME EXERCISE PROGRAM:  Access Code: [email or web address] URL: [email or web address] Date: 02/29/2024 Prepared by: Stann Ohara  Exercises - Supine Bridge with Resistance Band  - 1 x daily - 7 x weekly - 1-2 sets - 10 reps - 2 seconds  hold - Clamshell with Resistance  - 1 x daily - 7 x weekly - 1-2 sets - 10 reps - 1 seconds  hold - Seated Knee Extension with Anchored Resistance  - 1 x daily - 7 x weekly - 1-2 sets - 10 reps - 2 seconds  hold - Tandem Stance in Corner  - 1 x daily - 7 x weekly - 1-2 sets - 2 reps - 10 hold - Standing Lumbar Extension at Wall - Forearms  - 1 x daily - 7 x weekly - 1 sets - 10 reps - 2 hold  ASSESSMENT:  CLINICAL IMPRESSION:  Pt still presents with uncontrolled involuntary movements in the L LE; difficulty with eccentric control during activity. Pt unsteady on feet with dynamic balance activity utilizes quad base cane. Pt will continue to benefit from skilled PT to increase LE strength and improve gait quality.    EVAL: Patient is a 84 y.o. F who was seen today for physical therapy evaluation and treatment for I63.9 (ICD-10-CM) - Cerebrovascular accident (CVA), unspecified mechanism (HCC. Very high fall risk, required a lot of education and cues for safety. Daughter present for eval and very supportive, will be doing pool walking and exercise with her. Anticipate with family support that she will improve with skilled PT services.    OBJECTIVE IMPAIRMENTS: Abnormal gait, decreased activity tolerance, decreased balance, decreased cognition, decreased coordination, decreased mobility, difficulty walking, decreased strength, and decreased safety awareness.  ACTIVITY LIMITATIONS: sitting, standing, squatting, transfers, and locomotion level  PARTICIPATION LIMITATIONS: meal prep, cleaning, driving, shopping, community activity, and yard work  PERSONAL FACTORS: Age, Behavior pattern, Education, Fitness, Past/current experiences, and Time since onset of injury/illness/exacerbation are also affecting patient's functional outcome.   REHAB POTENTIAL: Good  CLINICAL DECISION MAKING:  Stable/uncomplicated  EVALUATION COMPLEXITY: Low   GOALS: Goals reviewed with patient? No  SHORT TERM GOALS: Target date: 10/26/2023   Will be compliant with appropriate progressive HEP  Baseline: Goal status: met 12/13/23  2.  Will be able to name 3 ways to reduce fall risk at home and in the community  Baseline:  Goal status: needs more education on fall risk and maybe using something more stable than cane 11/29/23,  Progressing can name 2 ways 01/04/24; unchanged 03/28/24  3.  Concerns regarding L LE burning and strength to have resolved by at least 75% Baseline:  Goal status: no improvement 11/29/23, on going 01/04/24, met 03/15/23  4.  Will be compliant with use of appropriate LRAD to reduce fall risk  Baseline:  Goal status: met 12/13/23    LONG TERM GOALS: Target date: 04/05/24, additional 8 weeks as of 02/09/24    MMT to be 5/5 in all tested groups  Baseline:  Goal status: ongoing 01/04/24. IN PRoGRESS still some defiits 03/28/24  2.  Will score at least 18 on DGI to show reduced fall risk  Baseline:  Goal status: 02/09/24: 9, declined since initial eval; Unchanged 03/28/24  3.  Will have been able to return to walking for exercise with family as desired  Baseline:  Goal status: progressing 12/06/23, ongoing 01/04/24, progressing 03/15/23, has not tried 03/28/24  4.  PSFS to have improved by at least 2 points  Baseline:  Goal status: progressing 12/06/23, Ongoing 01/04/24, progressing 03/15/23     PLAN:  PT FREQUENCY: 2x/week  PT DURATION: 8 weeks  PLANNED INTERVENTIONS: 97750- Physical Performance Testing, 97110-Therapeutic exercises, 97530- Therapeutic activity, 97112- Neuromuscular re-education, 97535- Self Care, [id number]- Manual therapy, [id number]- Gait training, and [phone]- Aquatic Therapy  PLAN FOR NEXT SESSION: focus on balance and safety with functional mobility, strength and gait. Work on attention and safety awareness in general, slow her down and work on sequencing  and coordination. ESTIM, slow movements as tolerated   Thersia Alder, Student-PT 03/28/24 6:34 PM  "

## 2024-03-30 ENCOUNTER — Ambulatory Visit: Admitting: Physical Therapy

## 2024-04-04 ENCOUNTER — Ambulatory Visit

## 2024-04-06 ENCOUNTER — Ambulatory Visit
